# Patient Record
Sex: Female | Born: 1935 | Race: White | Hispanic: No | Marital: Married | Smoking: Never smoker
Health system: Southern US, Community
[De-identification: ages and names within clinical notes are randomized; demographics above are authoritative.]

## PROBLEM LIST (undated history)

## (undated) DIAGNOSIS — R55 Syncope and collapse: Secondary | ICD-10-CM

## (undated) DIAGNOSIS — R011 Cardiac murmur, unspecified: Secondary | ICD-10-CM

## (undated) DIAGNOSIS — E785 Hyperlipidemia, unspecified: Secondary | ICD-10-CM

## (undated) DIAGNOSIS — I10 Essential (primary) hypertension: Secondary | ICD-10-CM

## (undated) DIAGNOSIS — I38 Endocarditis, valve unspecified: Secondary | ICD-10-CM

## (undated) HISTORY — DX: Syncope and collapse: R55

## (undated) HISTORY — DX: Hyperlipidemia, unspecified: E78.5

## (undated) HISTORY — DX: Essential (primary) hypertension: I10

## (undated) HISTORY — PX: CHOLECYSTECTOMY: SHX55

## (undated) HISTORY — DX: Endocarditis, valve unspecified: I38

## (undated) HISTORY — PX: OTHER SURGICAL HISTORY: SHX169

## (undated) HISTORY — DX: Cardiac murmur, unspecified: R01.1

## (undated) HISTORY — PX: SUBACROMIAL DECOMPRESSION: SHX5174

---

## 2004-06-29 ENCOUNTER — Other Ambulatory Visit: Payer: Self-pay

## 2006-11-20 ENCOUNTER — Ambulatory Visit: Payer: Self-pay | Admitting: Family Medicine

## 2009-01-17 ENCOUNTER — Ambulatory Visit: Payer: Self-pay | Admitting: Nephrology

## 2009-04-22 ENCOUNTER — Emergency Department: Payer: Self-pay | Admitting: Emergency Medicine

## 2011-04-25 ENCOUNTER — Observation Stay: Payer: Self-pay | Admitting: Internal Medicine

## 2011-12-25 ENCOUNTER — Ambulatory Visit: Payer: Self-pay | Admitting: Family Medicine

## 2012-06-29 ENCOUNTER — Ambulatory Visit: Payer: Self-pay | Admitting: Family Medicine

## 2012-07-14 ENCOUNTER — Ambulatory Visit: Payer: Self-pay | Admitting: Neurology

## 2012-11-14 ENCOUNTER — Observation Stay: Payer: Self-pay | Admitting: Student

## 2012-11-14 LAB — URINALYSIS, COMPLETE
Blood: NEGATIVE
Glucose,UR: NEGATIVE mg/dL (ref 0–75)
Ketone: NEGATIVE
Leukocyte Esterase: NEGATIVE
Nitrite: NEGATIVE
Specific Gravity: 1.006 (ref 1.003–1.030)
Squamous Epithelial: 3

## 2012-11-14 LAB — BASIC METABOLIC PANEL
Chloride: 106 mmol/L (ref 98–107)
Co2: 25 mmol/L (ref 21–32)
Creatinine: 1.86 mg/dL — ABNORMAL HIGH (ref 0.60–1.30)
EGFR (Non-African Amer.): 25 — ABNORMAL LOW
Osmolality: 283 (ref 275–301)
Potassium: 4 mmol/L (ref 3.5–5.1)

## 2012-11-14 LAB — CBC
MCHC: 33.8 g/dL (ref 32.0–36.0)
Platelet: 262 10*3/uL (ref 150–440)
RDW: 13.7 % (ref 11.5–14.5)
WBC: 10.7 10*3/uL (ref 3.6–11.0)

## 2012-11-14 LAB — CK-MB: CK-MB: <0.5 ng/mL — ABNORMAL LOW (ref 0.5–3.6)

## 2012-11-14 LAB — TROPONIN I
Troponin-I: <0.02 ng/mL
Troponin-I: <0.02 ng/mL

## 2012-11-15 LAB — BASIC METABOLIC PANEL
Anion Gap: 7 (ref 7–16)
Calcium, Total: 8.6 mg/dL (ref 8.5–10.1)
Chloride: 109 mmol/L — ABNORMAL HIGH (ref 98–107)
Co2: 23 mmol/L (ref 21–32)
Osmolality: 284 (ref 275–301)

## 2012-11-15 LAB — CBC WITH DIFFERENTIAL/PLATELET
Basophil #: 0.1 10*3/uL (ref 0.0–0.1)
HCT: 26.3 % — ABNORMAL LOW (ref 35.0–47.0)
Lymphocyte #: 2.5 10*3/uL (ref 1.0–3.6)
MCHC: 33.7 g/dL (ref 32.0–36.0)
MCV: 98 fL (ref 80–100)
Monocyte #: 1.2 x10 3/mm — ABNORMAL HIGH (ref 0.2–0.9)
Monocyte %: 13.8 %
Neutrophil #: 3.9 10*3/uL (ref 1.4–6.5)
Platelet: 258 10*3/uL (ref 150–440)
RDW: 14.1 % (ref 11.5–14.5)
WBC: 8.5 10*3/uL (ref 3.6–11.0)

## 2012-11-15 LAB — TROPONIN I: Troponin-I: <0.02 ng/mL

## 2012-12-07 ENCOUNTER — Encounter: Payer: Self-pay | Admitting: *Deleted

## 2012-12-08 ENCOUNTER — Ambulatory Visit: Payer: Self-pay | Admitting: Cardiovascular Disease

## 2012-12-11 ENCOUNTER — Encounter: Payer: Self-pay | Admitting: *Deleted

## 2012-12-22 ENCOUNTER — Encounter: Payer: Self-pay | Admitting: Cardiovascular Disease

## 2012-12-22 ENCOUNTER — Ambulatory Visit (INDEPENDENT_AMBULATORY_CARE_PROVIDER_SITE_OTHER): Payer: Medicare Other | Admitting: Cardiovascular Disease

## 2012-12-22 VITALS — BP 163/82 | HR 84 | Ht 61.0 in | Wt 131.0 lb

## 2012-12-22 DIAGNOSIS — I10 Essential (primary) hypertension: Secondary | ICD-10-CM

## 2012-12-22 DIAGNOSIS — R55 Syncope and collapse: Secondary | ICD-10-CM

## 2012-12-22 NOTE — Patient Instructions (Addendum)
Follow up as needed

## 2012-12-23 ENCOUNTER — Encounter: Payer: Self-pay | Admitting: Cardiovascular Disease

## 2012-12-23 DIAGNOSIS — I10 Essential (primary) hypertension: Secondary | ICD-10-CM | POA: Insufficient documentation

## 2012-12-23 DIAGNOSIS — R55 Syncope and collapse: Secondary | ICD-10-CM | POA: Insufficient documentation

## 2012-12-23 NOTE — Progress Notes (Signed)
HPI  This is an 79 year old pleasant female who is here today for a second opinion regarding syncope. She is accompanied by her daughter will arrange for the appointment. The patient's primary care physician is Dr. Yetta Barre. She has seen Dr. Gwen Pounds over the last few years. She has prolonged history of hypertension. She started having syncope 3 years ago which has been intermittent since then. She had 3 syncopal episodes in 2013. Most of these episodes happen while she is sitting down and not with a change in position. She usually feels dizzy followed by loss of consciousness for a few minutes. No seizure activity or incontinence. No confusion after the episode. She had no physical injuries except for one time when it happened while she was in the bathroom. One episode happened in the summertime when she did not drink enough fluids. She denies any chest pain or dyspnea. She underwent extensive cardiac and neurologic workup. She was seen by Dr. Sherryll Burger in neurology as well and underwent MRI and EEG. She was told there was no seizure activity. She denies palpitations. Her cardiac workup included a Holter monitor which showed only premature beats. A nuclear stress test which showed no evidence of ischemia and no exercise induced arrhythmia. Echocardiogram showed normal LV systolic function with moderate mitral and tricuspid regurgitation. Carotid duplex ultrasound showed no evidence of significant disease. She has chronic kidney disease. Lisinopril was stopped recently due to his dizziness. The dose of carvedilol was also decreased by half due to recurrent syncope.  No Known Allergies   Current Outpatient Prescriptions on File Prior to Visit  Medication Sig Dispense Refill  . amLODipine (NORVASC) 5 MG tablet Take 5 mg by mouth daily.      . sodium bicarbonate 650 MG tablet Take 650 mg by mouth 3 (three) times daily.         Past Medical History  Diagnosis Date  . Hyperlipidemia   . Syncope   .  Valvular heart disease   . Heart murmur   . Hypertension      Past Surgical History  Procedure Date  . Cholecystectomy   . Cataract surgery   . Cref     right distal radius fracture  . Subacromial decompression     left     History reviewed. No pertinent family history.   History   Social History  . Marital Status: Married    Spouse Name: N/A    Number of Children: N/A  . Years of Education: N/A   Occupational History  . Not on file.   Social History Main Topics  . Smoking status: Never Smoker   . Smokeless tobacco: Not on file  . Alcohol Use: No  . Drug Use: No  . Sexually Active:    Other Topics Concern  . Not on file   Social History Narrative  . No narrative on file     ROS Constitutional: Negative for fever, chills, diaphoresis, activity change, appetite change and fatigue.  HENT: Negative for hearing loss, nosebleeds, congestion, sore throat, facial swelling, drooling, trouble swallowing, neck pain, voice change, sinus pressure and tinnitus.  Eyes: Negative for photophobia, pain, discharge and visual disturbance.  Respiratory: Negative for apnea, cough, chest tightness, shortness of breath and wheezing.  Cardiovascular: Negative for chest pain, palpitations and leg swelling.  Gastrointestinal: Negative for nausea, vomiting, abdominal pain, diarrhea, constipation, blood in stool and abdominal distention.  Genitourinary: Negative for dysuria, urgency, frequency, hematuria and decreased urine volume.  Musculoskeletal: Negative for  myalgias, back pain, joint swelling, arthralgias and gait problem.  Skin: Negative for color change, pallor, rash and wound.  Neurological: Negative for dizziness, tremors, seizures, speech difficulty, weakness, numbness and headaches.  Psychiatric/Behavioral: Negative for suicidal ideas, hallucinations, behavioral problems and agitation. The patient is not nervous/anxious.     PHYSICAL EXAM   BP 163/82  Pulse 84  Ht 5'  1" (1.549 m)  Wt 131 lb (59.421 kg)  BMI 24.75 kg/m2 Constitutional: She is oriented to person, place, and time. She appears well-developed and well-nourished. No distress.  HENT: No nasal discharge.  Head: Normocephalic and atraumatic.  Eyes: Pupils are equal and round. Right eye exhibits no discharge. Left eye exhibits no discharge.  Neck: Normal range of motion. Neck supple. No JVD present. No thyromegaly present.  Cardiovascular: Normal rate, regular rhythm, normal heart sounds. Exam reveals no gallop and no friction rub. There is a 2/6 systolic ejection murmur at the aortic and left sternal border. Pulmonary/Chest: Effort normal and breath sounds normal. No stridor. No respiratory distress. She has no wheezes. She has no rales. She exhibits no tenderness.  Abdominal: Soft. Bowel sounds are normal. She exhibits no distension. There is no tenderness. There is no rebound and no guarding.  Musculoskeletal: Normal range of motion. She exhibits no edema and no tenderness.  Neurological: She is alert and oriented to person, place, and time. Coordination normal.  Skin: Skin is warm and dry. No rash noted. She is not diaphoretic. No erythema. No pallor.  Psychiatric: She has a normal mood and affect. Her behavior is normal. Judgment and thought content normal.     NFA:OZHYQ  Rhythm  Low voltage in precordial leads.   -Poor R-wave progression -may be secondary to pulmonary disease   consider old anterior infarct.   ABNORMAL    ASSESSMENT AND PLAN

## 2012-12-23 NOTE — Assessment & Plan Note (Signed)
The patient has recurrent and explained syncope. Neurologic workup was unremarkable. These episodes are not orthostatic. She is also not orthostatic by physical exam today. She does have a cardiac murmur suggestive of possible outflow obstruction. Most of these episodes happen at rest in the sitting position. One episode happened in the bathroom. Another episode happened in the setting of what seems to be volume depletion. After the visit, I reviewed her echocardiogram done at [facility]. There was significant basal septal hypertrophy with possible LVOT obstruction. Gradients were not recorded with Valsalva maneuver. I will discuss with the patient repeating echocardiogram with Valsalva maneuver to see if she has any LVOT obstruction to explain her recurrent syncope. I also discussed with them the possibility of obtaining longer outpatient telemetry to look for and diagnosed arrhythmia given that the Holter monitor was unrevealing.

## 2012-12-23 NOTE — Assessment & Plan Note (Signed)
I agree with accepting a higher target of blood pressure did have recurrent syncope.

## 2012-12-24 ENCOUNTER — Telehealth: Payer: Self-pay

## 2012-12-24 NOTE — Telephone Encounter (Signed)
Unable to get through at listed # for pt.  I tried 3 different ways and all 3 numbers were "nonworking" I found pt's grandson's # in [facility] system. I called this # and was able to [facility] That # was [phone]

## 2012-12-24 NOTE — Telephone Encounter (Signed)
Pt informed Understanding verb tx to [facility] to make appt for echo

## 2012-12-24 NOTE — Telephone Encounter (Signed)
Message copied by [facility], Jaquane Boughner E on Thu Dec 24, 2012  8:24 AM ------      Message from: Lorine Bears A      Created: Wed Dec 23, 2012  1:27 PM       Please let the patient know or her daughter that I reviewed the echocardiogram done at [facility]. There is a possibility that the cardiac murmur that she has might have something to do with her recurrent syncope. I want to repeat the echocardiogram in our office and request the echo to be done with Valsalva maneuver to check for left ventricular outflow tract obstruction.      I already ordered the echocardiogram.

## 2013-01-05 ENCOUNTER — Other Ambulatory Visit: Payer: Self-pay

## 2013-01-05 ENCOUNTER — Other Ambulatory Visit (INDEPENDENT_AMBULATORY_CARE_PROVIDER_SITE_OTHER): Payer: Medicare Other

## 2013-01-05 DIAGNOSIS — R55 Syncope and collapse: Secondary | ICD-10-CM

## 2013-04-20 ENCOUNTER — Ambulatory Visit: Payer: Self-pay | Admitting: Family Medicine

## 2013-07-13 ENCOUNTER — Ambulatory Visit: Payer: Self-pay | Admitting: Internal Medicine

## 2013-07-22 LAB — RETICULOCYTES
Absolute Retic Count: 0.0407 10*6/uL
Reticulocyte: 1.22 %

## 2013-07-22 LAB — OCCULT BLOOD X 1 CARD TO LAB, STOOL
Occult Blood, Feces: NEGATIVE
Occult Blood, Feces: NEGATIVE

## 2013-07-22 LAB — FERRITIN: Ferritin (ARMC): 41 ng/mL (ref 8–388)

## 2013-07-22 LAB — CANCER CENTER HEMOGLOBIN: HGB: 10.1 g/dL — ABNORMAL LOW (ref 12.0–16.0)

## 2013-07-23 ENCOUNTER — Ambulatory Visit: Payer: Self-pay | Admitting: Internal Medicine

## 2013-07-27 LAB — CANCER CENTER HEMOGLOBIN: HGB: 10.2 g/dL — ABNORMAL LOW (ref 12.0–16.0)

## 2013-08-10 LAB — CANCER CENTER HEMOGLOBIN: HGB: 10.3 g/dL — ABNORMAL LOW (ref 12.0–16.0)

## 2013-08-23 ENCOUNTER — Ambulatory Visit: Payer: Self-pay | Admitting: Internal Medicine

## 2013-09-07 LAB — IRON AND TIBC: Iron Bind.Cap.(Total): 316 ug/dL (ref 250–450)

## 2013-09-14 LAB — CBC CANCER CENTER
Basophil #: 0.1 x10 3/mm (ref 0.0–0.1)
Eosinophil %: 5.6 %
HCT: 28.2 % — ABNORMAL LOW (ref 35.0–47.0)
Lymphocyte %: 23.1 %
MCH: 31.8 pg (ref 26.0–34.0)
MCV: 95 fL (ref 80–100)
Monocyte #: 0.9 x10 3/mm (ref 0.2–0.9)
Neutrophil #: 4.5 x10 3/mm (ref 1.4–6.5)
Neutrophil %: 58.3 %
WBC: 7.7 x10 3/mm (ref 3.6–11.0)

## 2013-09-22 ENCOUNTER — Ambulatory Visit: Payer: Self-pay | Admitting: Internal Medicine

## 2013-09-27 LAB — CANCER CENTER HEMOGLOBIN: HGB: 10.3 g/dL — ABNORMAL LOW (ref 12.0–16.0)

## 2013-10-23 ENCOUNTER — Ambulatory Visit: Payer: Self-pay | Admitting: Internal Medicine

## 2013-10-26 LAB — CANCER CENTER HEMOGLOBIN: HGB: 9.9 g/dL — ABNORMAL LOW (ref 12.0–16.0)

## 2013-11-22 ENCOUNTER — Ambulatory Visit: Payer: Self-pay | Admitting: Internal Medicine

## 2014-01-18 ENCOUNTER — Ambulatory Visit: Payer: Self-pay | Admitting: Internal Medicine

## 2014-01-18 LAB — CANCER CENTER HEMOGLOBIN: HGB: 9.9 g/dL — ABNORMAL LOW (ref 12.0–16.0)

## 2014-01-23 ENCOUNTER — Ambulatory Visit: Payer: Self-pay | Admitting: Internal Medicine

## 2014-02-21 ENCOUNTER — Ambulatory Visit: Payer: Self-pay | Admitting: Family Medicine

## 2014-08-08 ENCOUNTER — Ambulatory Visit: Payer: Self-pay

## 2014-08-30 ENCOUNTER — Inpatient Hospital Stay: Payer: Self-pay | Admitting: Internal Medicine

## 2014-08-30 LAB — PRO B NATRIURETIC PEPTIDE: B-Type Natriuretic Peptide: 1261 pg/mL — ABNORMAL HIGH (ref 0–450)

## 2014-08-30 LAB — CBC
HCT: 25 % — ABNORMAL LOW (ref 35.0–47.0)
HGB: 7.8 g/dL — ABNORMAL LOW (ref 12.0–16.0)
MCH: 28.8 pg (ref 26.0–34.0)
MCHC: 31.3 g/dL — AB (ref 32.0–36.0)
MCV: 92 fL (ref 80–100)
PLATELETS: 414 10*3/uL (ref 150–440)
RBC: 2.72 10*6/uL — AB (ref 3.80–5.20)
RDW: 15.8 % — AB (ref 11.5–14.5)
WBC: 12.8 10*3/uL — AB (ref 3.6–11.0)

## 2014-08-30 LAB — BASIC METABOLIC PANEL
Anion Gap: 8 (ref 7–16)
BUN: 34 mg/dL — ABNORMAL HIGH (ref 7–18)
Calcium, Total: 8.8 mg/dL (ref 8.5–10.1)
Chloride: 108 mmol/L — ABNORMAL HIGH (ref 98–107)
Co2: 20 mmol/L — ABNORMAL LOW (ref 21–32)
Creatinine: 2.1 mg/dL — ABNORMAL HIGH (ref 0.60–1.30)
EGFR (Non-African Amer.): 21 — ABNORMAL LOW
GFR CALC AF AMER: 24 — AB
Glucose: 110 mg/dL — ABNORMAL HIGH (ref 65–99)
OSMOLALITY: 280 (ref 275–301)
Potassium: 4.3 mmol/L (ref 3.5–5.1)
SODIUM: 136 mmol/L (ref 136–145)

## 2014-08-30 LAB — TROPONIN I

## 2014-08-31 LAB — CBC WITH DIFFERENTIAL/PLATELET
BASOS ABS: 0.1 10*3/uL (ref 0.0–0.1)
BASOS PCT: 1.2 %
EOS ABS: 1.6 10*3/uL — AB (ref 0.0–0.7)
EOS PCT: 13.6 %
HCT: 29 % — ABNORMAL LOW (ref 35.0–47.0)
HGB: 9.5 g/dL — ABNORMAL LOW (ref 12.0–16.0)
LYMPHS PCT: 10.7 %
Lymphocyte #: 1.3 10*3/uL (ref 1.0–3.6)
MCH: 30 pg (ref 26.0–34.0)
MCHC: 32.8 g/dL (ref 32.0–36.0)
MCV: 92 fL (ref 80–100)
MONO ABS: 1.9 x10 3/mm — AB (ref 0.2–0.9)
MONOS PCT: 16.4 %
NEUTROS ABS: 6.8 10*3/uL — AB (ref 1.4–6.5)
Neutrophil %: 58.1 %
PLATELETS: 410 10*3/uL (ref 150–440)
RBC: 3.17 10*6/uL — ABNORMAL LOW (ref 3.80–5.20)
RDW: 15.1 % — ABNORMAL HIGH (ref 11.5–14.5)
WBC: 11.7 10*3/uL — ABNORMAL HIGH (ref 3.6–11.0)

## 2014-08-31 LAB — COMPREHENSIVE METABOLIC PANEL
ANION GAP: 9 (ref 7–16)
AST: 18 U/L (ref 15–37)
Albumin: 2.2 g/dL — ABNORMAL LOW (ref 3.4–5.0)
Alkaline Phosphatase: 67 U/L
BILIRUBIN TOTAL: 0.8 mg/dL (ref 0.2–1.0)
BUN: 31 mg/dL — AB (ref 7–18)
Calcium, Total: 8.8 mg/dL (ref 8.5–10.1)
Chloride: 107 mmol/L (ref 98–107)
Co2: 22 mmol/L (ref 21–32)
Creatinine: 2 mg/dL — ABNORMAL HIGH (ref 0.60–1.30)
EGFR (Non-African Amer.): 22 — ABNORMAL LOW
GFR CALC AF AMER: 26 — AB
GLUCOSE: 102 mg/dL — AB (ref 65–99)
OSMOLALITY: 282 (ref 275–301)
Potassium: 4.2 mmol/L (ref 3.5–5.1)
SGPT (ALT): 9 U/L — ABNORMAL LOW
Sodium: 138 mmol/L (ref 136–145)
Total Protein: 6.5 g/dL (ref 6.4–8.2)

## 2014-08-31 LAB — PROTIME-INR
INR: 1.1
PROTHROMBIN TIME: 13.7 s (ref 11.5–14.7)

## 2014-08-31 LAB — MAGNESIUM: Magnesium: 1.8 mg/dL

## 2014-09-01 LAB — BASIC METABOLIC PANEL
ANION GAP: 9 (ref 7–16)
BUN: 41 mg/dL — ABNORMAL HIGH (ref 7–18)
CALCIUM: 8.8 mg/dL (ref 8.5–10.1)
Chloride: 100 mmol/L (ref 98–107)
Co2: 24 mmol/L (ref 21–32)
Creatinine: 2.7 mg/dL — ABNORMAL HIGH (ref 0.60–1.30)
EGFR (Non-African Amer.): 16 — ABNORMAL LOW
GFR CALC AF AMER: 18 — AB
Glucose: 100 mg/dL — ABNORMAL HIGH (ref 65–99)
Osmolality: 277 (ref 275–301)
Potassium: 4.2 mmol/L (ref 3.5–5.1)
Sodium: 133 mmol/L — ABNORMAL LOW (ref 136–145)

## 2014-09-01 LAB — CBC WITH DIFFERENTIAL/PLATELET
BASOS ABS: 0.1 10*3/uL (ref 0.0–0.1)
BASOS PCT: 1.3 %
EOS PCT: 16.4 %
Eosinophil #: 1.9 10*3/uL — ABNORMAL HIGH (ref 0.0–0.7)
HCT: 28.7 % — AB (ref 35.0–47.0)
HGB: 9.3 g/dL — ABNORMAL LOW (ref 12.0–16.0)
Lymphocyte #: 2.1 10*3/uL (ref 1.0–3.6)
Lymphocyte %: 18.7 %
MCH: 29.6 pg (ref 26.0–34.0)
MCHC: 32.3 g/dL (ref 32.0–36.0)
MCV: 92 fL (ref 80–100)
Monocyte #: 2.1 x10 3/mm — ABNORMAL HIGH (ref 0.2–0.9)
Monocyte %: 18.2 %
Neutrophil #: 5.2 10*3/uL (ref 1.4–6.5)
Neutrophil %: 45.4 %
Platelet: 401 10*3/uL (ref 150–440)
RBC: 3.12 10*6/uL — AB (ref 3.80–5.20)
RDW: 15.3 % — ABNORMAL HIGH (ref 11.5–14.5)
WBC: 11.4 10*3/uL — AB (ref 3.6–11.0)

## 2014-09-02 LAB — BASIC METABOLIC PANEL
ANION GAP: 9 (ref 7–16)
BUN: 41 mg/dL — ABNORMAL HIGH (ref 7–18)
CO2: 22 mmol/L (ref 21–32)
CREATININE: 2.34 mg/dL — AB (ref 0.60–1.30)
Calcium, Total: 8.4 mg/dL — ABNORMAL LOW (ref 8.5–10.1)
Chloride: 98 mmol/L (ref 98–107)
EGFR (African American): 21 — ABNORMAL LOW
EGFR (Non-African Amer.): 18 — ABNORMAL LOW
GLUCOSE: 98 mg/dL (ref 65–99)
OSMOLALITY: 269 (ref 275–301)
Potassium: 3.8 mmol/L (ref 3.5–5.1)
SODIUM: 129 mmol/L — AB (ref 136–145)

## 2014-09-26 ENCOUNTER — Ambulatory Visit: Payer: Self-pay | Admitting: Specialist

## 2014-10-18 ENCOUNTER — Ambulatory Visit: Payer: Self-pay | Admitting: Internal Medicine

## 2014-10-18 LAB — CANCER CENTER HEMOGLOBIN: HGB: 11.2 g/dL — AB (ref 12.0–16.0)

## 2014-10-18 LAB — FERRITIN: Ferritin (ARMC): 52 ng/mL (ref 8–388)

## 2014-10-23 ENCOUNTER — Ambulatory Visit: Payer: Self-pay | Admitting: Internal Medicine

## 2014-11-15 LAB — FERRITIN: Ferritin (ARMC): 42 ng/mL (ref 8–388)

## 2014-11-22 ENCOUNTER — Ambulatory Visit: Payer: Self-pay | Admitting: Internal Medicine

## 2014-12-14 LAB — CANCER CENTER HEMOGLOBIN: HGB: 10.6 g/dL — ABNORMAL LOW (ref 12.0–16.0)

## 2014-12-23 ENCOUNTER — Ambulatory Visit: Payer: Self-pay | Admitting: Internal Medicine

## 2015-01-03 LAB — CBC CANCER CENTER
Basophil #: 0 x10 3/mm (ref 0.0–0.1)
Basophil %: 0.3 %
EOS PCT: 6.7 %
Eosinophil #: 0.6 x10 3/mm (ref 0.0–0.7)
HCT: 33.7 % — AB (ref 35.0–47.0)
HGB: 10.9 g/dL — ABNORMAL LOW (ref 12.0–16.0)
LYMPHS ABS: 2.3 x10 3/mm (ref 1.0–3.6)
Lymphocyte %: 23.9 %
MCH: 31.5 pg (ref 26.0–34.0)
MCHC: 32.3 g/dL (ref 32.0–36.0)
MCV: 97 fL (ref 80–100)
Monocyte #: 1.1 x10 3/mm — ABNORMAL HIGH (ref 0.2–0.9)
Monocyte %: 11 %
NEUTROS ABS: 5.6 x10 3/mm (ref 1.4–6.5)
Neutrophil %: 58.1 %
PLATELETS: 215 x10 3/mm (ref 150–440)
RBC: 3.46 10*6/uL — AB (ref 3.80–5.20)
RDW: 15.7 % — AB (ref 11.5–14.5)
WBC: 9.6 x10 3/mm (ref 3.6–11.0)

## 2015-01-23 ENCOUNTER — Ambulatory Visit: Payer: Self-pay | Admitting: Internal Medicine

## 2015-01-23 IMAGING — CR DG CHEST 2V
2 series · 2 of 2 positions shown · non-contrast
Comparison: 04/25/2011.

CLINICAL DATA: Shortness of breath.

EXAM:
CHEST  2 VIEW

[chest pa]
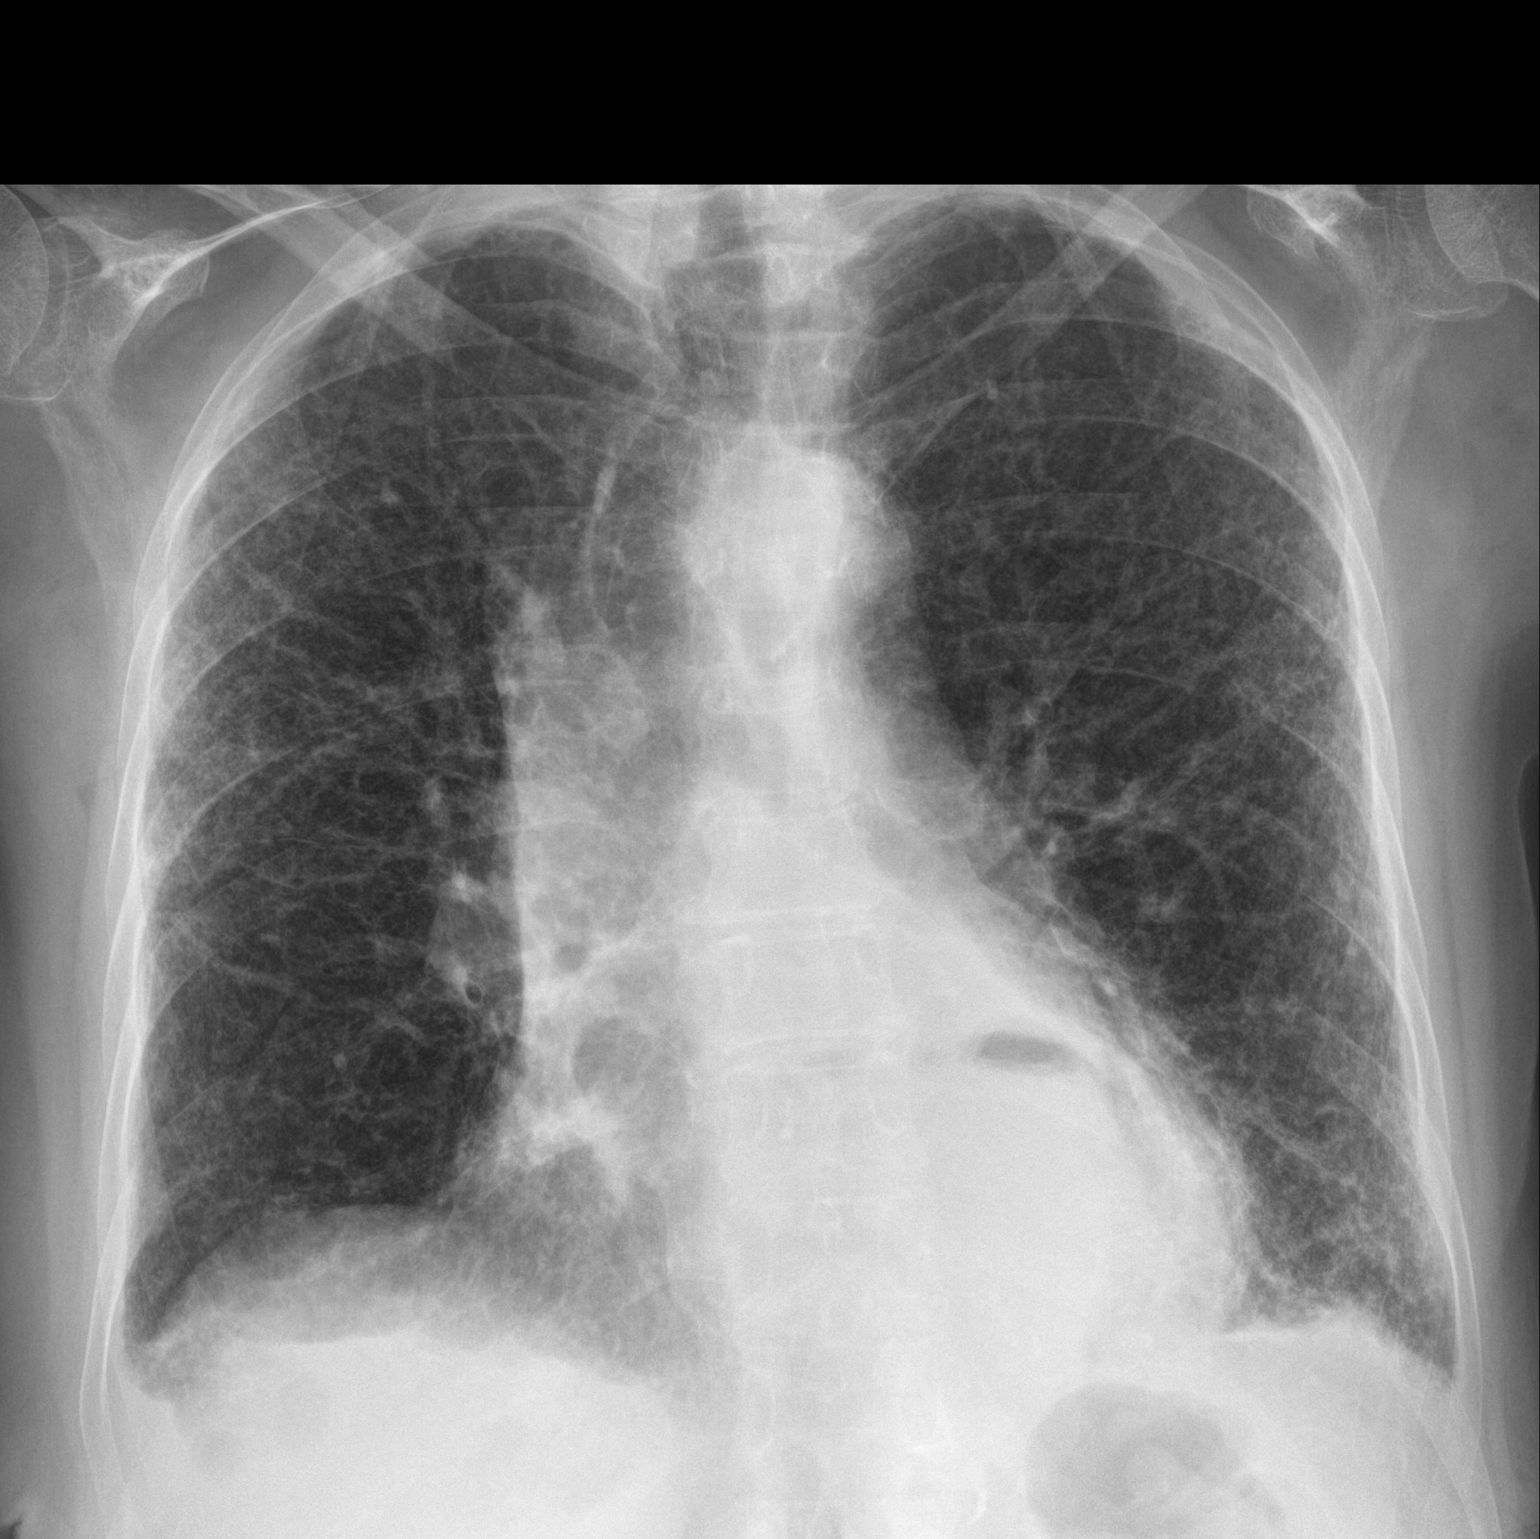

[chest lat]
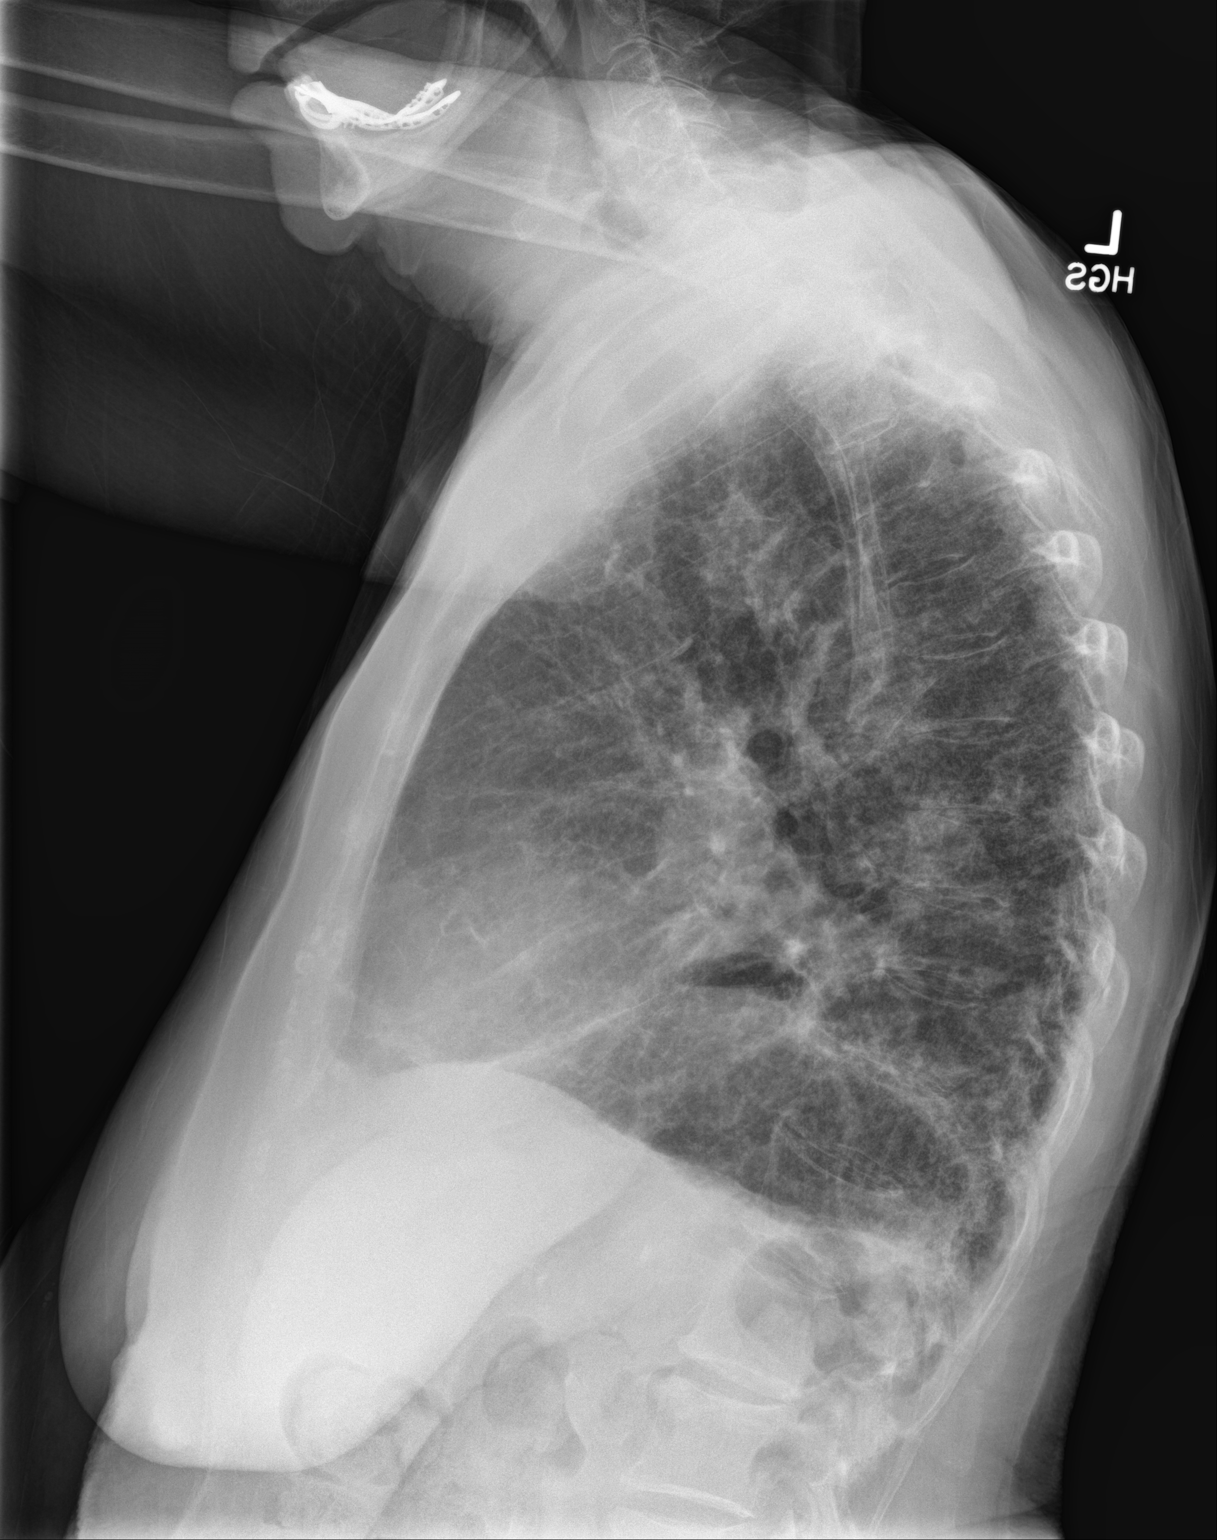

[2 of 2 positions shown; findings below may reference images not displayed]

FINDINGS: The heart is borderline enlarged but stable. The mediastinal and
hilar contours are stable. There is tortuosity and calcification of
the thoracic aorta a moderate to large size hiatal hernia is noted.
Definite progression of interstitial lung disease with peripheral
pulmonary fibrosis. High-resolution chest CT imaging would be
helpful for further evaluation (non urgent). No infiltrates or
effusions. The bony thorax is intact.
IMPRESSION: Significant progression of interstitial lung disease.

No infiltrates or effusions.

Moderate to large hiatal hernia.

## 2015-02-14 IMAGING — CR DG CHEST 2V
1 series · 2 of 2 positions shown · non-contrast
Comparison: 08/08/2014

CLINICAL DATA: Shortness of breath.

EXAM:
CHEST  2 VIEW

[Series 1: x chest ap · 0.14mm/px · 2 of 2 slices shown]
[im 1/2]
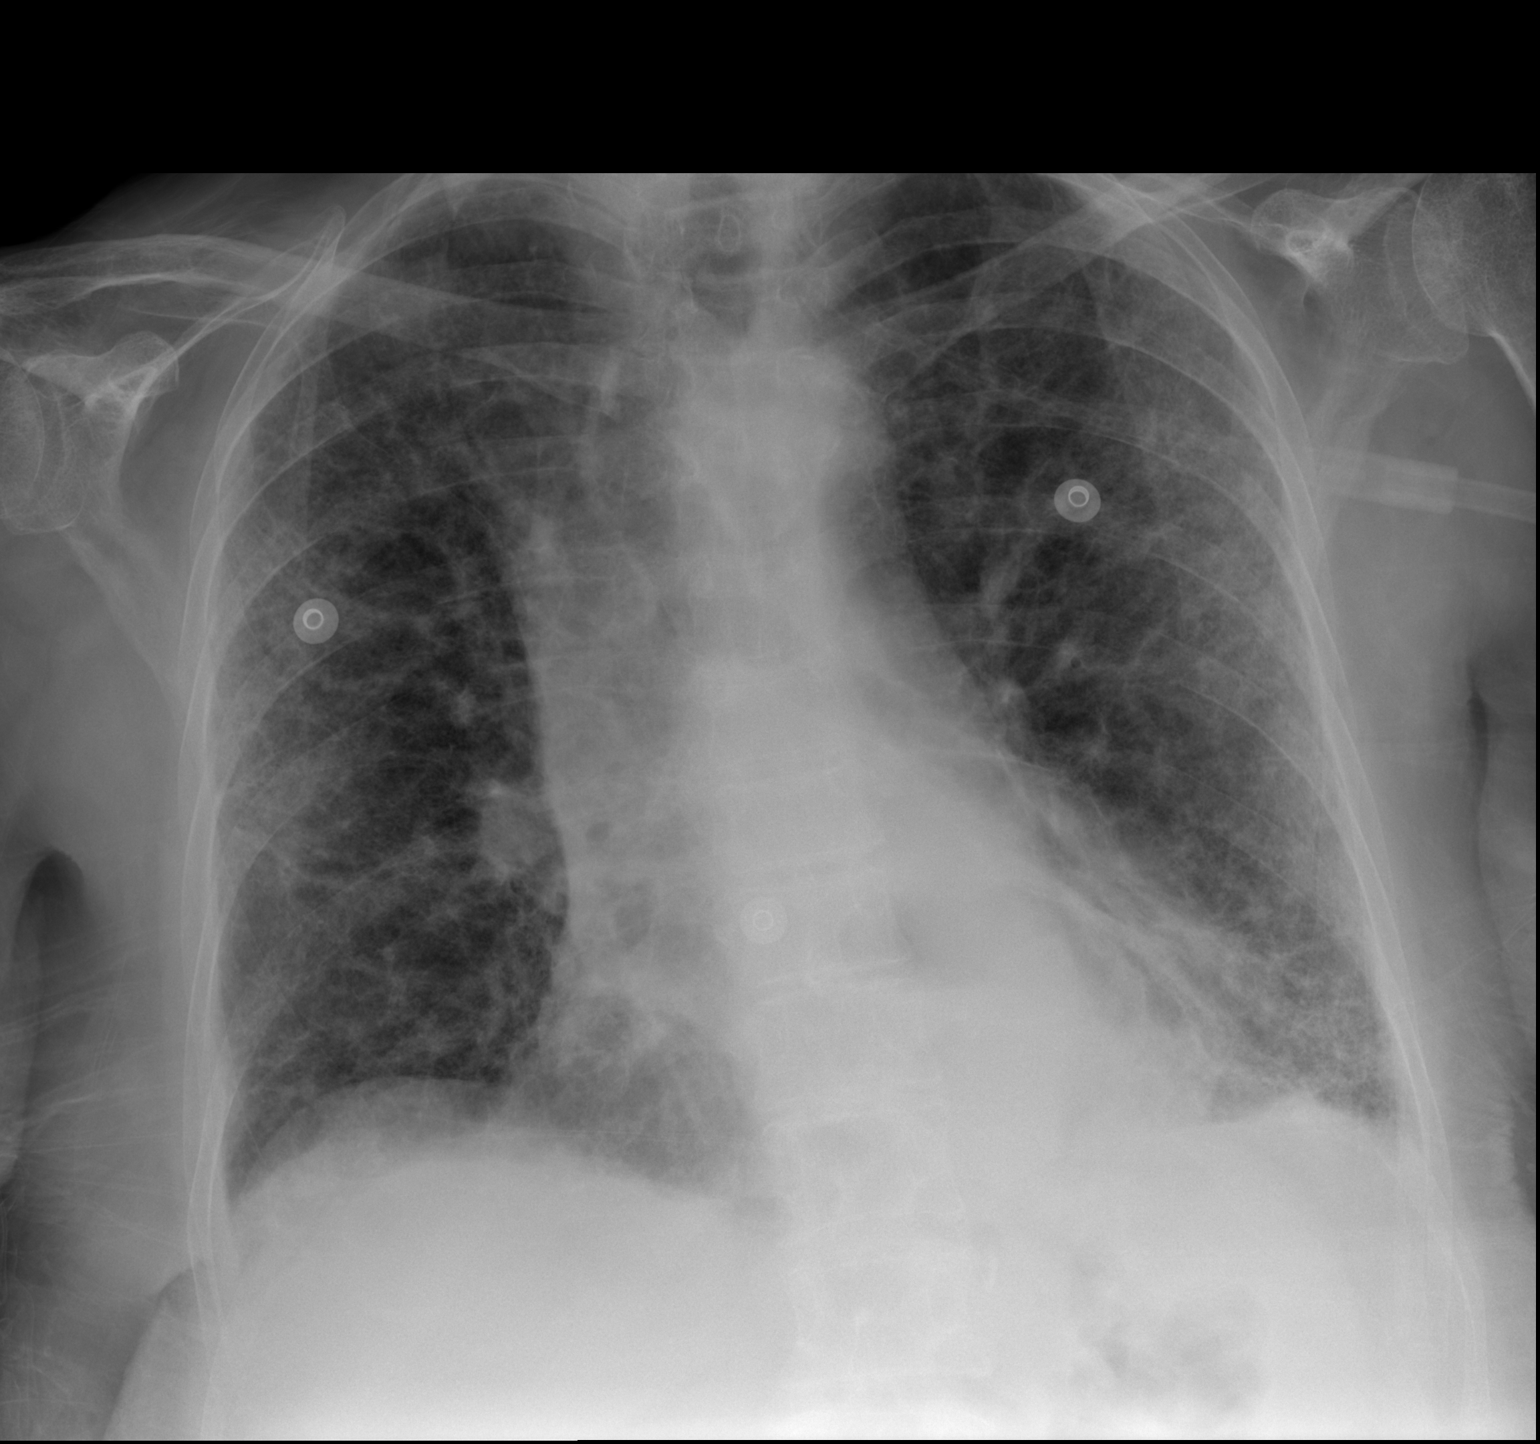
[im 2/2]
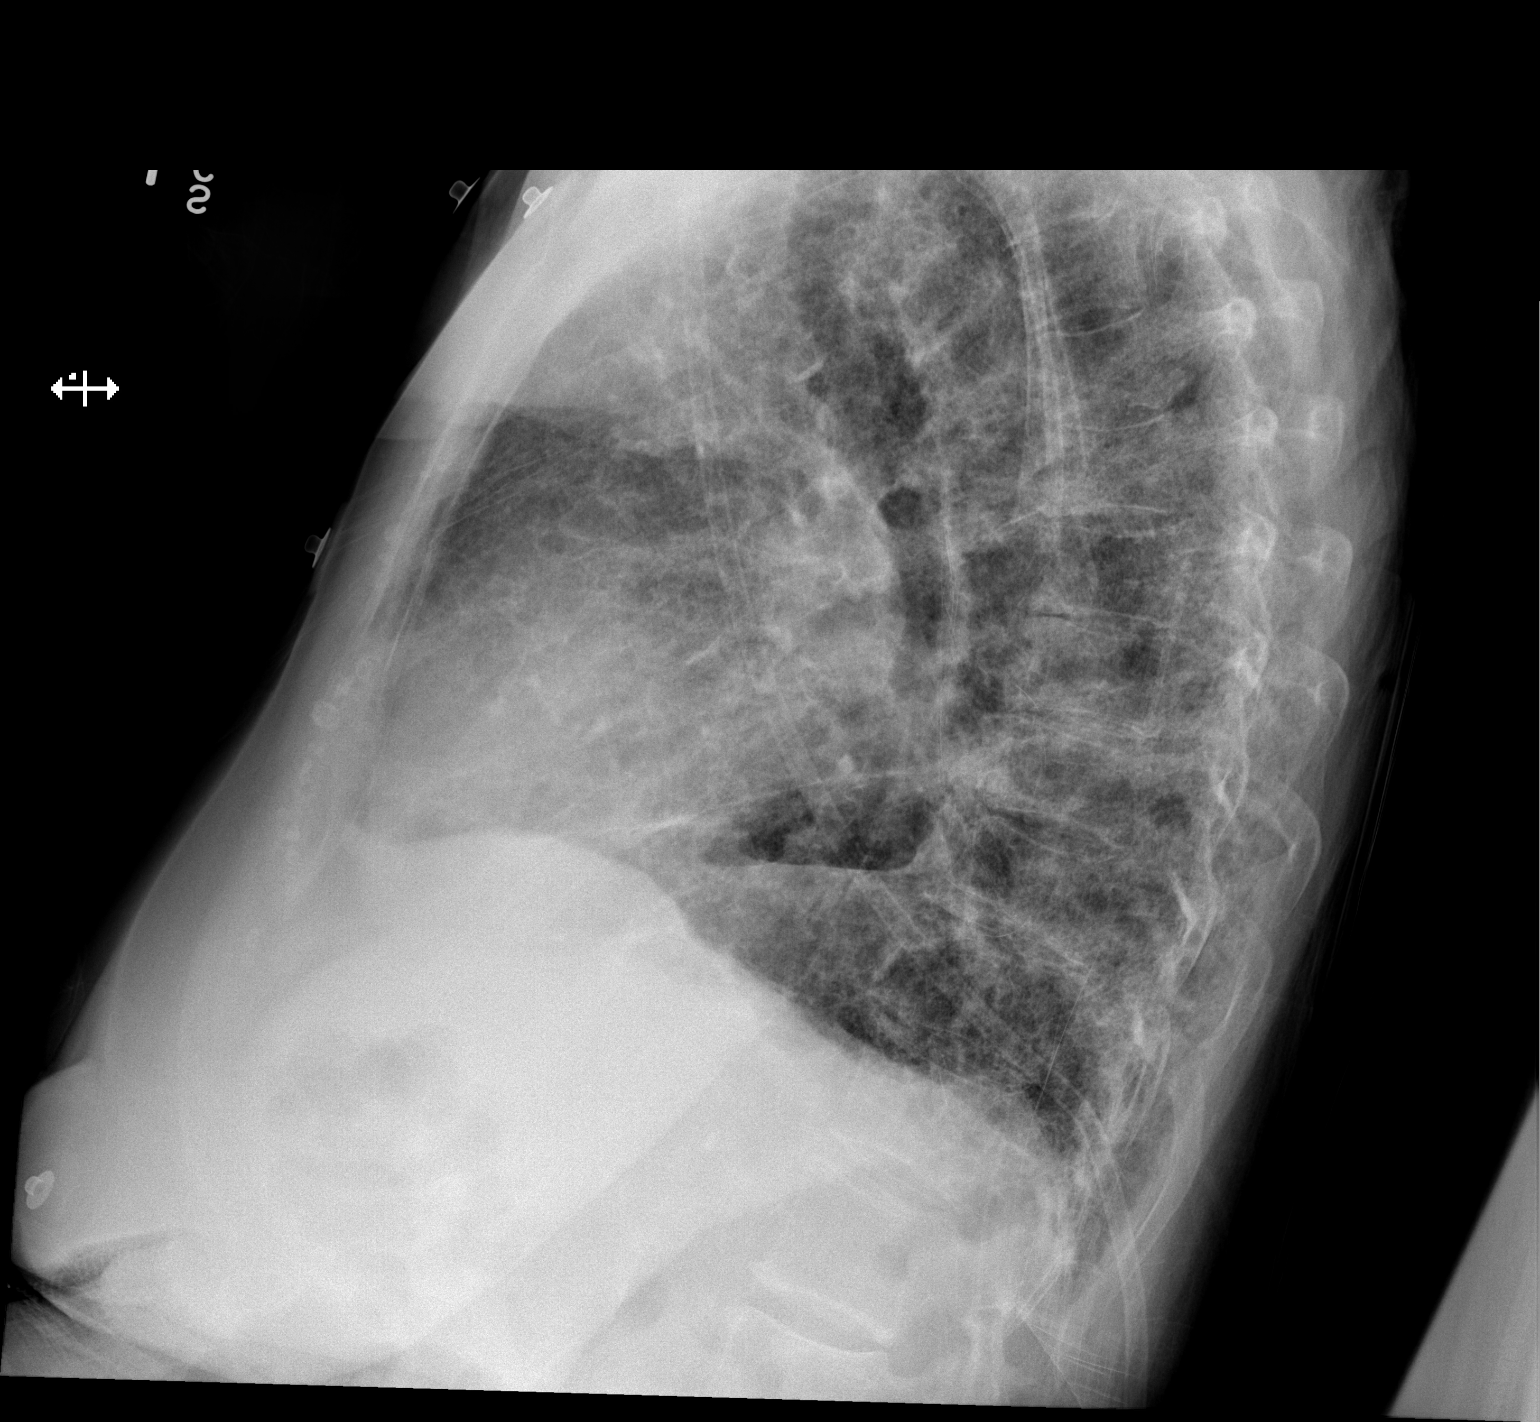

[2 of 2 positions shown; findings below may reference images not displayed]

FINDINGS: Osteopenia. Midline trachea. Mild cardiomegaly. Moderate hiatal
hernia suspected. Superior mediastinal soft tissue fullness is
likely due to AP portable technique on the frontal. No pleural
effusion or pneumothorax. Lower lobe predominant and peripheral
interstitial prominence is progressive. Left base pulmonary opacity
is new or increased.
IMPRESSION: Progressive peripheral and lower lobe interstitial prominence.
Suspicious for interstitial lung disease, possibly usual
interstitial pneumonia. A chest CT is pending an will be
informative. Cannot exclude superimposed mild interstitial edema.

Moderate hiatal hernia.

Patchy left base opacity for which superimposed infection or
aspiration is a concern.

## 2015-02-21 ENCOUNTER — Ambulatory Visit
Admit: 2015-02-21 | Disposition: A | Discharge status: Home / Self Care | Payer: Self-pay | Attending: Internal Medicine | Admitting: Internal Medicine

## 2015-03-13 IMAGING — CR DG CHEST 2V
2 series · 2 of 2 positions shown · non-contrast
Comparison: CT chest 08/30/2014, chest x-ray 08/30/2014.

CLINICAL DATA: Shortness of breath.  Follow-up pneumonia x6 weeks.

EXAM:
CHEST  2 VIEW

[chest pa]
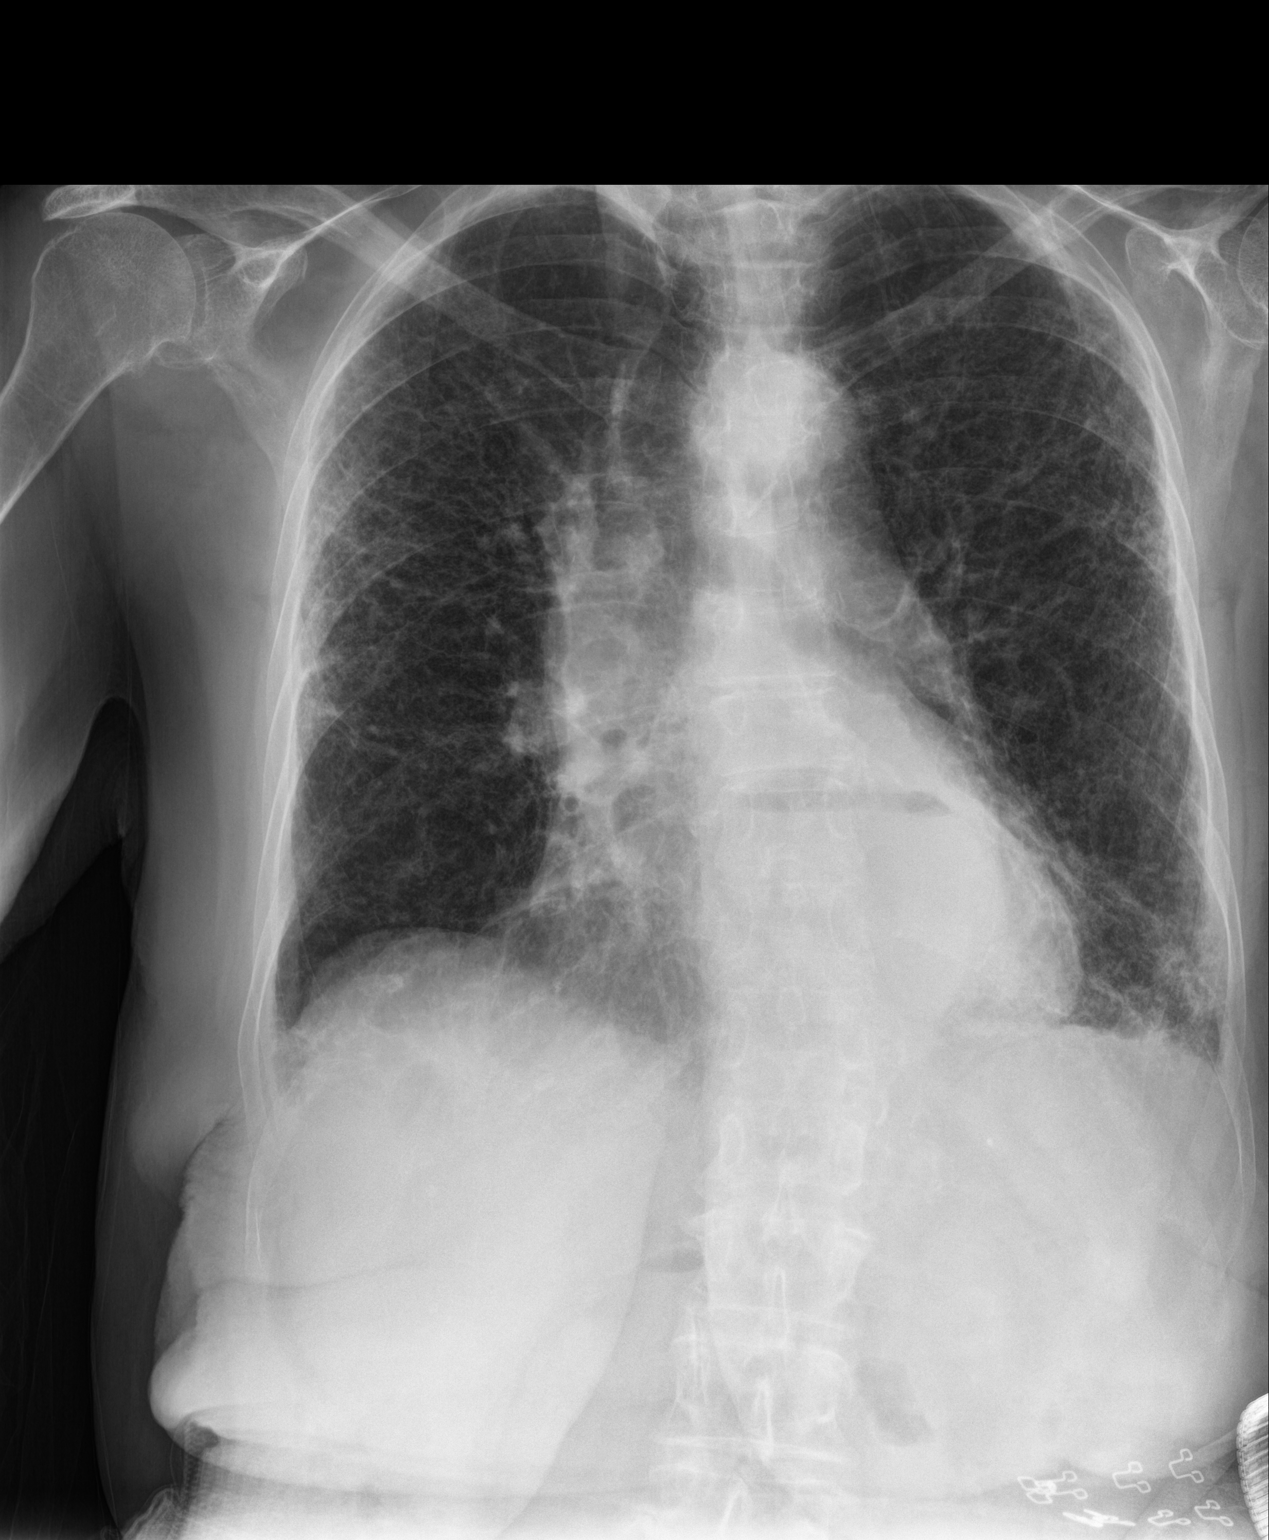

[chest lat]
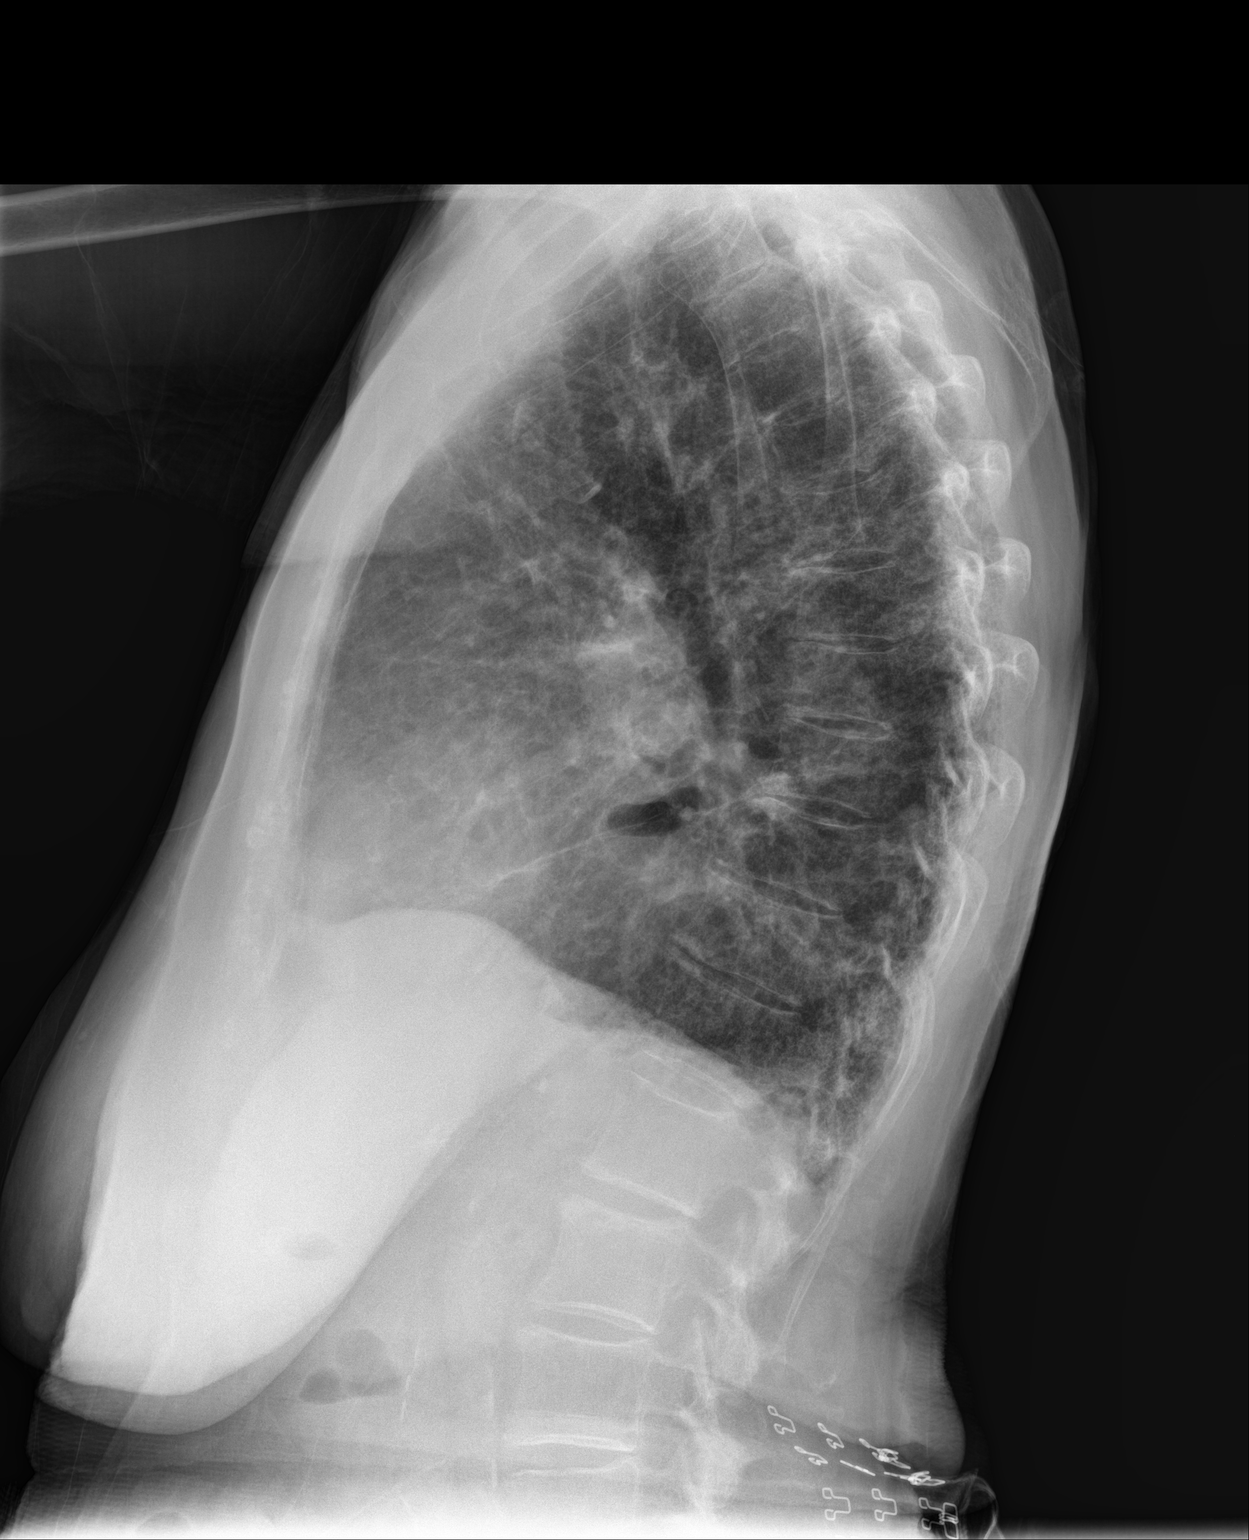

[2 of 2 positions shown; findings below may reference images not displayed]

FINDINGS: Mediastinum and hilar structures are normal. Stable cardiomegaly,
normal pulmonary vascularity. Stable unchanged diffuse from
interstitial lung disease present. No improvement from prior exam.
No significant pleural effusion or pneumothorax. Sliding hiatal
hernia. No acute bony abnormality.
IMPRESSION: 1. Diffuse pulmonary interstitial lung disease, no improvement from
prior study of 08/30/2014.

2.  Sliding hiatal hernia.

## 2015-03-24 ENCOUNTER — Ambulatory Visit
Admit: 2015-03-24 | Disposition: A | Discharge status: Home / Self Care | Payer: Self-pay | Attending: Internal Medicine | Admitting: Internal Medicine

## 2015-04-11 NOTE — Discharge Summary (Signed)
PATIENT NAME:  Misty Martinez, Misty Martinez MR#:  [id number] DATE OF BIRTH:  [birth year]  DATE OF ADMISSION:  11/14/2012 DATE OF DISCHARGE:  11/16/2012  CONSULTANT: Dr. Welton FlakesKhan from cardiology.   CHIEF COMPLAINT: Syncope.   DISCHARGE DIAGNOSES:  1. Recurrent syncope, possibly cardiac arrhythmia in nature.  2. Hypertension.  3. Chronic kidney disease, stage III to IV.  4. Arthritis.   DISCHARGE MEDICATIONS:  1. Coreg 12.5 mg 1 tab 2 times a day.  2. Aspirin 81 mg daily. 3. Sodium bicarbonate 650 mg 1 tab three times a day.  4. Amlodipine 5 mg daily.  5. Ocuvite lutein 1 tab once a day.  6. Vitamin B12 100 mcg one cap daily.   DIET: Low sodium ADA diet, renal diet.   ACTIVITY: As tolerated.   DISCHARGE FOLLOW UP: Please follow with your PCP, Dr. Gwen PoundsKowalski and ENT within 1 to 2 weeks.   DISPOSITION: Home.   HISTORY OF PRESENT ILLNESS: For full because of the history and physical please see the dictation on 11/14/2012 by Dr. Elpidio AnisSudini, but briefly this is a pleasant 81 year old female with chronic kidney disease stage III and recurrent syncope, hypertension who presents with another syncopal episode passing out for about a minute while at church. Per history and physical no pulse was noticed and cardiopulmonary resuscitation was started. Patient woke up within a minute. In the ER cardiac enzymes were normal. EKG: Look unchanged from prior and orthostatic vitals were negative for orthostatic hypotension therefore was admitted to the hospitalist service for further evaluation and management.    LABORATORY, DIAGNOSTIC AND RADIOLOGICAL DATA: Initial creatinine of 1.86, on 11/24 it was 1.75. Troponins negative x3. CK-MB were within normal limits x3. Initial WBC 10.7, hemoglobin 9.6, platelets 262. Urinalysis not suggestive of infection. Echocardiogram has been done, result is not back yet.   HOSPITAL COURSE: Patient was admitted to the hospitalist service with remote telemetry off unit. Patient had no  significant arrhythmias. Patient went back to her baseline. She was ruled out for acute coronary syndrome. She has had extensive work-up in the past including evaluation by cardiology and neurology with negative MRI, CAT scan and EEG per patient. At this point given the recurrent nature of her syncope I discussed the case with Dr. Gwen PoundsKowalski, her outpatient cardiologist. She was seen by Dr. Welton FlakesKhan from cardiology while she was inpatient. At this point the best point of follow up is for patient to undergo a 30 day event monitor to see if she has any significant arrhythmias which are infrequent. I have discussed the case personally with cardiopulmonary to try to arrange this and they will try to arrange it as an outpatient test only. Per Dr. Gwen PoundsKowalski she is okay to be discharged at this point with outpatient follow up also. She would also benefit from following with ENT for further testing. Her chronic kidney disease and blood pressure remained stable. She is back to her baseline and will be discharged with outpatient follow up.   TOTAL TIME SPENT: 35 minutes.   CODE STATUS: Patient is FULL CODE.   ____________________________ Krystal EatonShayiq Forest Pruden, MD sa:cms D: 11/16/2012 15:55:21 ET T: 11/17/2012 12:47:39 ET JOB#: [id number]  cc: Krystal EatonShayiq Shaana Acocella, MD, <Dictator> Lamar BlinksBruce J. Kowalski, MD Krystal EatonSHAYIQ Kyndal Gloster MD ELECTRONICALLY SIGNED 12/03/2012 11:10

## 2015-04-11 NOTE — H&P (Signed)
PATIENT NAME:  Misty Martinez, Misty Martinez MR#:  [id number] DATE OF BIRTH:  1927/10/20  DATE OF ADMISSION:  11/14/2012  PRIMARY CARE PHYSICIAN: Elizabeth Sauereanna Jones, MD  PRIMARY CARDIOLOGIST: Arnoldo HookerBruce Kowalski, MD  CHIEF COMPLAINT: Syncope.   The case was discussed with Dr. Shaune PollackLord, history obtained from the patient, family members at bedside, old records and imaging studies reviewed.  HISTORY OF PRESENT ILLNESS: This is an 81 year old Caucasian female patient with history of CKD stage III, prior syncope, and hypertension who presents to the Emergency Room with episode of syncope and passing out for one minute at church. The patient was sitting down at church and suddenly had a syncopal episode and collapsed. Two nurses in the church tried to start CPR but the patient woke up within a minute. The patient was not found to have any pulse at this time. Upon arrival by EMS, the patient was up, awake, and more comfortable. Here in the ER, the patient's cardiac enzymes are normal, EKG looks unchanged from prior, orthostatics are negative, and the patient is being admitted to the hospitalist service under observation for syncope. The patient has had syncopal episodes for over five years, but have been more frequent recently. The patient mentions that she has a distant feeling prior to an episode. She is extremely concerned that she might hurt herself. In the past, she had knee problems and fracture secondary to syncope.   The patient follows with Dr. Gwen PoundsKowalski and had follow-up in October, did not have any tests, a regular physical, and was told everything was fine. No change in medications. She did have an EEG in July of 2013 after syncope in June for suspicion for seizure, but was found to be normal. She had an echo and carotid Doppler's one year prior.   PAST MEDICAL HISTORY:  1. Hypertension.  2. Chronic kidney disease stage III. 3. Arthritis.   SOCIAL HISTORY: The patient lives with her husband, does not smoke, no alcohol,  and no illicit drugs.   CODE STATUS: FULL CODE.   ALLERGIES: No known drug allergies.   FAMILY HISTORY: Mother died at 4384 of Alzheimer's. Father died at 2389 of heart failure. Sister has hypertension. Brother died of alcoholism.   HOME MEDICATIONS:  1. Amlodipine 5 mg oral once a day.  2. Aspirin 81 mg oral once a day.  3. Coreg 12.5 mg oral twice a day. 4. Ocuvite 1 tablet oral once a day.  5. Sodium bicarbonate 650 mg oral three times daily. 6. Vitamin B12 100 mcg oral once a day.   REVIEW OF SYSTEMS: CONSTITUTIONAL: No fever, fatigue, or weakness. No blurred vision, pain, or redness. ENT: No tinnitus, ear pain, or hearing loss. RESPIRATORY: No cough, wheeze, or hemoptysis. CARDIOVASCULAR: No chest pain or orthopnea. Had syncope. GASTROINTESTINAL: No nausea, vomiting, diarrhea, or abdominal pain. GENITOURINARY: No dysuria, hematuria, or renal calculus. Has chronic kidney disease. ENDOCRINE: No polyuria, nocturia, or thyroid problems. HEMATOLOGIC/LYMPHATIC: No anemia, easy bruising, or bleeding. MUSCULOSKELETAL: Has arthritis in multiple joints. No gout. NEUROLOGIC: No focal numbness, weakness, or dysarthria. Positive syncope. PSYCHIATRIC: No anxiety or depression.   PHYSICAL EXAMINATION:   VITAL SIGNS: Temperature 97.8, blood pressure 144/53, and pulse 68.   Orthostatic vitals show blood pressure 123/49 and pulse 69 on lying down and 140/56 and 78 on standing up.   GENERAL: Elderly, moderately built, Caucasian female patient lying in bed, comfortable, conversational, cooperative with exam.   PSYCHIATRIC: Alert and oriented x3. Mood and affect appropriate. Judgment intact.   HEENT: Atraumatic,  normocephalic. Oral mucosa moist and pink. External ears and nose normal. No pallor. No icterus. Pupils bilaterally equal and reactive to light.   NECK: Supple. No thyromegaly. No palpable lymph nodes. Trachea midline. No carotid bruit or JVD.   CARDIOVASCULAR: S1 and S2, regular rate and rhythm  without any murmurs. No edema. Peripheral pulses 2+.   RESPIRATORY: Normal work of breathing. Clear to auscultation on both sides. Normal to percussion.   GI: Soft abdomen, nontender. Bowel sounds present. No hepatosplenomegaly palpable.   SKIN: Warm and dry. No petechiae, rash, or ulcers.   NEUROLOGICAL: Motor strength five out of five in upper and lower extremities. Sensation to fine touch intact all over. Cranial nerves II through XII intact.   LYMPHATIC: No cervical lymphadenopathy.   LABORATORY, DIAGNOSTIC AND RADIOLOGIC DATA: Glucose 144, BUN 31, creatinine 1.86, sodium 137, and potassium 4. Troponin less than 0.02. WBC 10.7 and hemoglobin 9.6.   Urinalysis shows no WBC or bacteria.   EKG shows normal sinus rhythm with nonspecific ST-T wave changes, unchanged from prior EKG.   ASSESSMENT AND PLAN:  1. Recurrent syncope. The patient has had multiple scans in the past which have not shown any etiology, but today the patient had prolonged one minute syncope with no pulse found, as per two nurses at the scene. CPR was not started. Concern for AV dissociation or pause. We will admit the patient on the telemetry floor. We will repeat a 2-D echocardiogram. We will not have to repeat any CT scan, EEG, or carotid Dopplers, which the patient had previously. We will consult Dr. Gwen Pounds, her cardiologist, for further input. We will see if the patient has any pause or arrhythmias on telemetry. Check two more sets of cardiac enzymes. Orthostatics negative.  2. Hypertension. Continue home medications.  3. Chronic kidney disease stage III, stable.  4. Deep vein thrombosis prophylaxis with heparin.   CODE STATUS: FULL CODE.   TIME SPENT: Time spent today on this case was 65 minutes with more than 50% time spent in coordination of care. ____________________________ Molinda Bailiff. Brodin Gelpi, MD srs:slb D: 11/14/2012 14:44:57 ET T: 11/14/2012 15:08:54 ET JOB#: [id number]  cc: Wardell Heath R. Elpidio Anis, MD,  <Dictator> Duanne Limerick, MD Lamar Blinks, MD Orie Fisherman MD ELECTRONICALLY SIGNED 11/14/2012 16:17

## 2015-04-11 NOTE — Consult Note (Signed)
PATIENT NAME:  Misty Martinez, Misty Martinez MR#:  [id number] DATE OF BIRTH:  03-Apr-1927  DATE OF CONSULTATION:  11/15/2012  REFERRING PHYSICIAN:   CONSULTING PHYSICIAN:  Laurier NancyShaukat A. Neeya Prigmore, MD  INDICATION FOR CONSULTATION: Syncope.   HISTORY OF PRESENT ILLNESS:  This is an 81 year old white female with a past medical history of renal insufficiency, multiple episodes of syncope, hypertension, who came into the Emergency Room with another episode where she passed out at church lasting for like one minute. She states she collapsed in the past also multiple times. She had a neurological work-up including MRI of the brain, carotid Doppler and electroencephalogram and saw neurologist. There was no evidence of seizures or any other neurological abnormalities. She was also evaluated cardiac point of view and everything was negative in the past. Now she again had another episode, so was brought by EMS for evaluation. She denies any chest pain, shortness of breath, or dizziness right now.   PAST MEDICAL HISTORY:  1. History of hypertension. 2. Renal insufficiency. 3. Arthritis.   SOCIAL HISTORY: Unremarkable.   ALLERGIES: None.   FAMILY HISTORY: Mother had Alzheimer, sister had hypertension. Brother died of alcoholism.   MEDICATIONS:  1. Aspirin. 2. Amlodipine. 3. Coreg. 4. Vitamins.   PHYSICAL EXAMINATION:  GENERAL: She is alert, oriented x3, in no acute distress.   VITAL SIGNS: Blood pressure 122/69, respirations 18, pulse 73, temperature 97.7, saturation 96.   NECK: No carotid bruit. No JVD.   LUNGS: Clear.   HEART: Regular rate and rhythm. A 2/6 systolic murmur at the left lower sternal border.   ABDOMEN: Soft, nontender, positive bowel sounds.   EXTREMITIES: No pedal edema.   NEUROLOGIC: She appears to be intact.   LABORATORY DATA: BUN is 32, creatinine 1.75. Hemoglobin is 8.9. Troponin is less than 0.02.   ASSESSMENT AND PLAN: The patient has had a syncopal episode. Agree with getting  echocardiogram to further evaluate. She had already neurological and cardiac work-up. She may have intermittent ventricular or supraventricular arrhythmia causing these syncopal episodes. She never had a catheterization but she has renal insufficiency and she is 85. She denies any chest pain. If this persists, may consider doing catheterization and may have to have EP evaluation. Thank you very much for the referral.   ____________________________ Laurier NancyShaukat A. Meleena Munroe, MD sak:ap D: 11/15/2012 10:48:00 ET T: 11/15/2012 11:02:52 ET JOB#: [id number]  cc: Laurier NancyShaukat A. Valinda Fedie, MD, <Dictator>  Laurier NancySHAUKAT A Sayda Grable MD ELECTRONICALLY SIGNED 11/17/2012 8:55

## 2015-04-15 NOTE — Consult Note (Signed)
PATIENT NAME:  Misty Martinez, Misty Martinez MR#:  [id number] DATE OF BIRTH:  30-Oct-1927  DATE OF CONSULTATION:  08/31/2014  REFERRING PHYSICIAN:  Delfino LovettVipul Shah, MD CONSULTING PHYSICIAN:  Lamar BlinksBruce J. Jawana Reagor, MD  REASON FOR CONSULTATION: Acute heart failure, respiratory failure, hypertension, valvular insufficiency, chronic kidney disease.   CHIEF COMPLAINT: "I'Martinez short of breath."   HISTORY OF PRESENT ILLNESS: This is an 81 year old female with known diastolic dysfunction with respiratory failure, hypertension, hyperlipidemia, mitral insufficiency who has had significant progression of severe shortness of breath over the last week, waxing and waning, with hypoxia and significant chest x-ray changes consistent with pneumonia and pulmonary edema. This is suggestive of acute on chronic diastolic congestive heart failure due to hypoxia and mitral valve insufficiency, non-rheumatic in nature. She also had peripheral vascular disease with carotid atherosclerosis, not effecting her at this time, exacerbated by anemia with a hemoglobin of 7.8, chronic kidney disease stage 4 with a GFR of 24, creatinine of 2.1. She did have a BNP of 1261, but an echocardiogram showing normal LV systolic function with ejection fraction of 60% and diastolic dysfunction. EKG showed normal sinus rhythm and the patient did not have a myocardial infarction with a normal troponin.  REVIEW OF SYSTEMS: Negative for vision change, ringing in the ears, hearing loss. Positive for cough, congestion. Negative for heartburn, nausea, vomiting, diarrhea, bloody stools, stomach pain, extremity pain, leg weakness, cramping of the buttocks, known blood clots, headaches, blackouts, dizzy spells, nosebleeds. Congestion was positive. No urinary frequency, skin lesions or skin rashes.   PAST MEDICAL HISTORY:  1.  Diastolic dysfunction.  2.  Hypertension.  3.  Valvular heart disease.  4.  Hyperlipidemia.  5.  Peripheral vascular disease with carotid atherosclerosis.   6.  Anemia.  7.  Chronic kidney disease.   FAMILY HISTORY: No family members with early onset of cardiovascular disease or hypertension.   SOCIAL HISTORY: Currently denies alcohol or tobacco use.   ALLERGIES: AS LISTED.   MEDICATIONS: As listed.   PHYSICAL EXAMINATION:  VITAL SIGNS: Blood pressure is 120/68 bilaterally. Heart rate is 70, upright, lying flat and reclining, irregular.  GENERAL: She is a well-appearing elderly female in no acute distress.  HEENT: No icterus, thyromegaly, ulcers, hemorrhage or xanthelasma.  CARDIOVASCULAR: Regular rate and rhythm. Normal S1 and S2. A 2/6 right upper sternal border murmur, nonradiating. PMI is inferiorly displaced. Carotid upstroke normal without bruit. Jugular venous pressure is normal.  LUNGS: Have expiratory wheezes, rhonchi and basilar crackles.  ABDOMEN: Soft, nontender, without hepatosplenomegaly or masses. Abdominal aorta is normal size without bruit.  EXTREMITIES: Show 2+ radial, femoral, dorsal pedal pulses, with no lower extremity edema, cyanosis, clubbing or ulcers.  NEUROLOGIC: She is oriented to time, place, and person, with normal mood and affect.   ASSESSMENT: An 81 year old female with acute on chronic diastolic dysfunction, congestive heart failure exacerbated by respiratory failure, bronchitis, hypoxia, anemia, chronic kidney disease stage 4, having peripheral vascular disease, hypertension, hyperlipidemia and mitral insufficiency.   RECOMMENDATIONS:  1.  Intravenous Lasix as able, watching closely for worsening chronic kidney disease for pulmonary edema.  2.  Oxygenation for hypoxia and heart failure.  3.  Serial ECG and enzymes to assess for possible myocardial infarction.  4.  Antibiotics for an acute respiratory failure and pneumonia.  5.  Continue hypertension control with beta blocker, but avoid ACE inhibitor due to significant chronic kidney disease  6.  No further intervention of non-rheumatic mitral valve  insufficiency.  7.  Further ambulation thereafter and  further treatment options after improvement of hypoxia.   ____________________________ Lamar Blinks, MD bjk:TT D: 08/31/2014 21:01:00 ET T: 08/31/2014 21:44:36 ET JOB#: [id number]  cc: Lamar Blinks, MD, <Dictator> Lamar Blinks MD ELECTRONICALLY SIGNED 09/21/2014 15:46

## 2015-04-15 NOTE — Discharge Summary (Signed)
PATIENT NAME:  Misty Martinez, Misty Martinez MR#:  [id number] DATE OF BIRTH:  Dec 12, 1927  DATE OF ADMISSION:  08/30/2014 DATE OF DISCHARGE:  09/02/2014  ADMITTING DIAGNOSES: Anemia.   DISCHARGE DIAGNOSES: 1.  Acute on chronic respiratory failure.  2.  Interstitial pneumonia.  3.  Suspected acute on chronic diastolic congestive heart failure.  4.  Gastroesophageal reflux disease with suspected nocturnal aspirations contributing to pulmonary fibrosis.  5.  Anemia of chronic disease, status post 1 unit of packed red blood cell transfusion.  6.  Acute on chronic renal failure with attempted diuresis.  7.  History of hypertension, syncope, chronic kidney disease stages 3 to 4 and arthritis.   DISCHARGE CONDITION: Stable.   DISCHARGE MEDICATIONS: The patient is to continue: 1.  Coreg 3.125 mg p.o. twice daily. 2.  Albuterol ipratropium 2.5 mg/0.5 mg in 3 mL inhalation solution 6 times daily as needed.  3.  Guaifenesin (Humibid LA) 600 mg p.o. twice daily.  4.  Senna 1 tablet twice daily as needed.  5.  Zithromax 500 mg p.o. twice daily for 7 days.  6.  Tussionex 5 mL twice daily as needed.  7.  Omeprazole 40 mg p.o. once daily (this is a new medication).   NOTE: The patient is not to take amlodipine unless recommended by primary care physician due to low blood pressure.  HOME OXYGEN: With portable tank at 2 liters of oxygen through nasal cannula at rest. The patient will need 4 to 5 liters of oxygen while ambulating. The patient should be weaned off of oxygen as tolerated keeping O2 sats around 92% and above.  DISCHARGE DIET: Two grams salt, regular consistency.   ACTIVITY LIMITATIONS: As tolerated.   REFERRALS: To physical therapy, also respiratory therapy.  DIETARY SPECIAL INSTRUCTIONS: The patient was advised not to eat or drink about 3 hours prior to bedtime to prevent aspiration risks. The patient's head of bed also has to be elevated by approximately 30 degrees to prevent aspiration.    DISCHARGE INSTRUCTIONS: Followup appointment with Dr. Elizabeth Sauereanna Jones in 2 days after discharge, Dr. Meredeth IdeFleming in 1 week after discharge, Dr. Gwen PoundsKowalski in 1 week after discharge.  CONSULTANTS: Dr. Gwen PoundsKowalski, Dr. Meredeth IdeFleming, care management, social work.   RADIOLOGIC STUDIES: Chest x-ray, PA and lateral, 8th of September 2015, revealing progressive peripheral and lower lobe interstitial prominence suspicious for interstitial lung disease, possibly usual interstitial pneumonia. A chest CT scan would be informative. Cannot exclude superimposed mild interstitial edema. Moderate hiatal hernia was also noted. Patchy left base opacity for which superimposed infection or aspiration was a concern.   CT scan of the chest without contrast, 8th of September 2015, showed findings consistent with usual interstitial pneumonia. A large hiatal hernia was also noted.   HISTORY OF PRESENT ILLNESS: The patient is an 81 year old Caucasian female who presented to the hospital with complaints of shortness of breath. Please refer to Dr. Margaretmary EddyShah's admission note on the 8th of September 2015. Apparently, the patient was short of breath for at least 3 weeks which was a significant change from her prior activity level. She was initiated on antibiotic therapy during this period of time as well as cough medications; however, she did not improve.  On arrival to the Emergency Room, she was noted to be hypoxic with O2 sats of 88% on room air, and she was admitted to the hospital for further evaluation. The patient's temperature was 98.7, pulse was 88, respiratory rate was 22, blood pressure 159/73 and saturation was 91% on 2  liters of oxygen through nasal cannula and 88% on room air. Physical exam revealed crepitations in lungs.   DIAGNOSTIC DATA: The patient's lab data done on admission, 8th of September 2015, revealed beta-type natriuretic peptide of 1,261, glucose 110, and BUN and creatinine were 34 and 2.1. Bicarbonate level was low at 20.  Estimated GFR was 21. Liver enzymes revealed albumin level of 2.2, otherwise unremarkable. Troponin level was less than 0.02. White blood cell count was elevated to 12.8, hemoglobin was 7.8 and platelet count was 414,000. Coagulation panel was unremarkable.   EKG showed normal sinus rhythm at 91 beats per minute, LVH with repolarization abnormality, and no acute ST-T changes were noted.   Chest x-ray revealed possible mild congestive heart failure as well as interstitial pneumonitis.   HOSPITAL COURSE: The patient was admitted to the hospital for further evaluation. She was initiated on broad-spectrum antibiotic therapy, including Rocephin as well as Zithromax. Consultations with Dr. Gwen Pounds as well as Dr. Meredeth Ide were obtained. The patient had echocardiogram done on the 9th of September 2015, which revealed normal left ventricular ejection fraction of 55% to 60%, normal global left ventricular systolic function, decreased left ventricular internal cavity size, mildly dilated left atrium, mild to moderate mitral valve regurgitation, mildly increased left ventricular posterior wall thickness and mild tricuspid regurgitation. The patient was initiated on diuretic therapy because of concerns of possible mild CHF. Unfortunately, with this, the patient's creatinine went up to 2.7 by the 10th of September 2015. At that time, the patient's diuretics were stopped and blood pressure medication, Norvasc, was also stopped due to relative hypotension with systolic blood pressure being around 116. The patient was continued on antibiotics as well as inhalation therapy and her condition somewhat improved. Her oxygenation improved; however, it still required 2 liters. She was evaluated by physical therapist and recommended rehabilitation placement. The patient admitted of having some aspiration problems and feeling acid in her thought intermittently. Because of that, we were concerned about possible aspiration of acid from  the stomach. We recommended the patient to lift up her head of bed by approximately 30 degrees whenever she lies down and not to take anything to eat or drink approximately 3 hours prior to bedtime to prevent risks of aspiration and heal her lungs. We also initiated her on proton pump inhibitor, Omeprazole. The patient was advised to continue Zithromax therapy for total of 10 days, 7 days after discharge, by Dr. Meredeth Ide. She is to continue also cough medications and inhalation therapy. She is to follow up with Dr. Meredeth Ide as well as Dr. Gwen Pounds in approximately 1 week after discharge.   In regards to suspected acute on chronic diastolic CHF, we are not initiating her on ACE inhibitor due to renal failure. We are also not able to initiate her on diuretics due to increased worsening kidney failure and because of that we do feel that she is euvolemic if not hypovolemic overall. We do not think that she needs to be on diuretic therapy at this point.  In regards to gastroesophageal reflux disease with suspected nocturnal aspirations, the patient is to continue PPIs and follow up with her primary care physician.  For anemia, the patient was transfused with 1 unit of packed red blood cells. Unfortunately, the patient's iron studies were not performed prior to transfusion. Posttransfusion the patient's hemoglobin level remained stable and it was felt that the patient's anemia was of chronic disease related to her chronic renal failure.   In regards to acute on  chronic renal failure, as mentioned above, the patient's creatinine level was 2.1 on the day of admission, the 8th of September 2015, which is about the patient's baseline, with estimated GFR of 21. The patient's creatinine worsened to 2.7 by the 10th of September 2015 with diuresis; however, with stopping diuresis the patient's creatinine improved to 2.34 on the day of discharge, 11th of September 2015.  GFR is 18. It is recommended to follow the patient's  kidney function tests and possibly have her follow up with a nephrologist as outpatient to ensure complete resolution of her acute on chronic renal failure.   In regards to chronic medical problems such as hypertension, the patient's Norvasc was placed on hold since her blood pressure was relatively low signifying likely hypovolemia. The patient's blood pressure medication, Norvasc, should be restarted if her blood pressure is elevated.   In regards to arthritis, the patient is to continue her outpatient management.   The patient is being discharged in stable condition with the above-mentioned medications and follow-up.   On the day of discharge, the patient is afebrile with temperature of 98, pulse was 76, respiration rate was 18, blood pressure 126/75 and saturation was 94% on 2 liters of oxygen through nasal cannula. However, it drops down to 80% on exertion, which requires the patient's O2 to be bumped up to 4 to 5 liters on ambulation keeping her O2 sats at around 92% and above and trying to wean her down to room air if possible.  TIME SPENT: 40 minutes. ____________________________ Katharina Caper, MD rv:sb D: 09/02/2014 11:56:10 ET T: 09/02/2014 12:46:22 ET JOB#: [id number]  cc: Katharina Caper, MD, <Dictator> Duanne Limerick, MD Herbon E. Meredeth Ide, MD Lamar Blinks, MD Rayen Dafoe Winona Legato MD ELECTRONICALLY SIGNED 09/14/2014 14:28

## 2015-04-15 NOTE — H&P (Signed)
PATIENT NAME:  Misty Martinez, Misty Martinez MR#:  [id number] DATE OF BIRTH:  1927-05-03  DATE OF ADMISSION:  08/30/2014  PRIMARY CARE PHYSICIAN: Duanne Limerick, MD  REQUESTING PHYSICIAN: Sheryl L. Mindi Junker, MD  CHIEF COMPLAINT: Shortness of breath.   HISTORY OF PRESENT ILLNESS: The patient is an 81 year old female with a known history of CKD stage III-IV, hypertension, and history of recurrent syncope. Is being admitted for acute on chronic respiratory failure due to diastolic heart failure. The patient has been feeling short of breath on and off for the last 2-3 weeks.   Had seen her primary care physician, who placed her on amoxicillin and some cough medication, but did not have much improvement.  Went to see physician at another Urgent Care where she was prescribed ProAir inhaler along with prednisone taper and Levaquin for possible acute bronchitis, but she continued to get worse and was feeling more and more short of breath. Finally, decided to come to the Emergency Department where she was found to be hypoxic with room air oxygen saturations of 88% and was placed on 2-3 liters oxygen. She was also found to be anemic with hemoglobin of 7.8 and was ordered 1 unit of blood in the Emergency Department. Her chest x-ray was showing possible interstitial pneumonia and her CT of the chest in the ED was consistent with interstitial pneumonia. She also had large hiatal hernia. The patient also reported low grade fever at home and some dry cough on and off for the last 2-3 weeks. She is being admitted for further evaluation and management.   PAST MEDICAL HISTORY: 1.  Hypertension.  2.  Recurrent syncope.  3.  CKD stage III-IV. 4.  Arthritis.   ALLERGIES: No known drug allergies.   SOCIAL HISTORY: She lives with her husband. No smoking. No alcohol. No IV drugs of abuse.   CODE STATUS: Full code.   FAMILY HISTORY: Mother died at the age of 24 due to Alzheimer's. Father died at the age of 48 due to heart  failure. Sister with hypertension; brother died of alcoholism.   MEDICATIONS AT HOME: 1.  Coreg 3.125 mg p.o. b.i.d.  2.  Amlodipine 5 mg p.o. daily.  3.  She just recently finished course of prednisone taper, antibiotics, and inhaler that she uses intermittently as needed without much help.  REVIEW OF SYSTEMS:  CONSTITUTIONAL: Positive for low grade fever, fatigue, and weakness.  EYES: No blurred or double vision.  ENT: No tinnitus or ear pain.  RESPIRATORY: Positive for dry cough, dyspnea.  CARDIOVASCULAR: No chest pain. Positive for dyspnea on exertion.  GASTROINTESTINAL: No nausea, vomiting, diarrhea.  GENITOURINARY: No dysuria or hematuria.  ENDOCRINE: No polyuria or nocturia.  HEMATOLOGY: Anemia present. No easy bruising or bleeding.  SKIN: No rash or lesion.  MUSCULOSKELETAL: Arthritis present.  NEUROLOGIC: No tingling, numbness, weakness. Positive for recurrent syncope.  PSYCHIATRY: No history of anxiety or depression.   PHYSICAL EXAMINATION: VITAL SIGNS: Temperature 98.7, heart rate 88 per minute, respirations 22 per minute, blood pressure 159/73 mmHg. She is saturating 91% on 2 liters oxygen via nasal cannula and was down to 88% at room air.  GENERAL: The patient is an 81 year old female lying in bed in acute respiratory distress.  HEENT: Pupils equal, round, reactive to light and accommodation. No scleral icterus. Extraocular muscles intact. Head atraumatic, normocephalic. Oropharynx and nasopharynx clear.  NECK: Supple. No jugular venous distention. No thyroid enlargement or tenderness.  LUNGS: Decreased breath sounds at the bases. No wheezing, rales. She  does have occasional rhonchi at the right lower lobe.  CARDIOVASCULAR: S1, S2 normal. No murmur, rubs, or gallop. ABDOMEN:  Soft, nontender, nondistended. Bowel sounds present. No organomegaly or masses.  EXTREMITIES: No pedal edema, cyanosis, or clubbing.  NEUROLOGIC: Cranial nerves III through XII intact. Muscle strength  5/5 in all extremities. Sensation intact.  PSYCHIATRIC: The patient is alert and oriented x 3.  SKIN: No obvious rash, lesion, or ulcer.  MUSCULOSKELETAL: No joint effusion or tenderness.   LABORATORY DATA:  BMP showed sodium 136, potassium 4.3, chloride 108, CO2 of 20, BUN 34, creatinine 2.10, blood sugar 110. BNP of 1261. Troponin of less than 0.02. CBC showed white count of 12.8, hemoglobin 7.8, hematocrit 25.0, platelet 414,000.   IMAGING:  Chest x-ray in the ED showed interstitial pneumonia. CT scan of the chest confirmed same findings with large hiatal hernia.   EKG did not show any major ST-T changes.   IMPRESSION AND PLAN: 1.  Acute hypoxic respiratory failure, likely due to pneumonia and/or congestive heart failure exacerbation with underlying severe anemia.  2.  Acute on chronic diastolic heart failure exacerbation. We will start her on intravenous Lasix twice a day.  Obtain cardiology consultation. Monitor on telemetry unit daily.  Get 2D echocardiogram, daily weight, and strict input and output will be maintained.  3.  Anemia, likely of chronic disease, certainly could have iron deficiency. Her stool Hemoccult was positive in the Emergency Department. She reports that she has had a gastroenterology doctor place her on iron therapy and her hemoglobin has been ranging anywhere from 9-10. This has been more lower.  She was already ordered 1 unit of blood in the Emergency Department that she will go ahead and receive.  She did have an elevated vitamin B12 level which was checked in 2014, and normal erythropoietin level. She had an iron level which was within normal limits.  This could be anemia of chronic disease. Consider gastroenterology consultation if she continues to drop her hemoglobin. She did have heme-positive stool. Please note, she has been following with Dr. Lorre NickGittin for evaluation of iron deficiency anemia and he considering IV iron if needed. He did recommend colonoscopy to evaluate  for any underlying gastrointestinal malignancy, but patient declined at that time for any luminal evaluation.  4.  Pneumonia. We will start her on intravenous Rocephin and Zithromax. Obtain sputum culture if able, doubt it.    5.  Chronic kidney disease stage III-IV, likely close to her baseline. Will monitor her kidney function while on diuresis. Consider nephrology consultation. She follows with Dr.  Mady HaagensenMunsoor Lateef.   6.  Code status, full code. Considering palliative care consultation.   Total time taking care of this patient was 45 minutes.    ____________________________ Ellamae SiaVipul S. Sherryll BurgerShah, MD vss:LT D: 08/30/2014 22:30:15 ET T: 08/30/2014 23:03:06 ET JOB#: [id number]  cc: Ami Mally S. Sherryll BurgerShah, MD, <Dictator> Duanne Limerickeanna C. Jones, MD Munsoor Lizabeth LeydenN. Lateef, MD Ellamae SiaVIPUL S [facility] MD ELECTRONICALLY SIGNED 09/05/2014 11:49

## 2015-08-07 ENCOUNTER — Other Ambulatory Visit: Payer: Self-pay | Admitting: Family Medicine

## 2016-02-12 ENCOUNTER — Ambulatory Visit (INDEPENDENT_AMBULATORY_CARE_PROVIDER_SITE_OTHER): Payer: Medicare Other

## 2016-02-12 ENCOUNTER — Ambulatory Visit
Admission: EM | Admit: 2016-02-12 | Discharge: 2016-02-12 | Disposition: A | Discharge status: Home / Self Care | Payer: Medicare Other | Attending: Family Medicine | Admitting: Family Medicine

## 2016-02-12 ENCOUNTER — Encounter: Payer: Self-pay | Admitting: *Deleted

## 2016-02-12 DIAGNOSIS — B349 Viral infection, unspecified: Secondary | ICD-10-CM

## 2016-02-12 MED ORDER — BENZONATATE 200 MG PO CAPS: 200.0000 mg | ORAL_CAPSULE | Freq: Three times a day (TID) | ORAL | Status: AC | PRN

## 2016-02-12 MED ORDER — IPRATROPIUM-ALBUTEROL 0.5-2.5 (3) MG/3ML IN SOLN
3.0000 mL | Freq: Once | RESPIRATORY_TRACT | Status: AC
  Administered 2016-02-12: 3 mL via RESPIRATORY_TRACT

## 2016-02-12 NOTE — ED Notes (Signed)
Patient started having nasal congestion, cough, and wheezing started 2 days ago.

## 2016-02-12 NOTE — ED Provider Notes (Signed)
CSN: [id number]     Arrival date & time 02/12/16  1000 History   First MD Initiated Contact with Patient 02/12/16 1218     Chief Complaint  Patient presents with  . Cough  . Nasal Congestion   (Consider location/radiation/quality/duration/timing/severity/associated sxs/prior Treatment) Patient is a 82 y.o. female presenting with URI. The history is provided by the patient.  URI Presenting symptoms: congestion, cough and rhinorrhea   Onset quality:  Sudden Duration:  3 days Timing:  Constant Progression:  Unchanged Chronicity:  New Relieved by:  None tried Ineffective treatments:  OTC medications Associated symptoms: wheezing   Associated symptoms: no arthralgias, no headaches, no myalgias and no sinus pain   Risk factors: being elderly and sick contacts   Risk factors: no chronic cardiac disease, no chronic kidney disease, no chronic respiratory disease, no diabetes mellitus, no immunosuppression, no recent illness and no recent travel     Past Medical History  Diagnosis Date  . Hyperlipidemia   . Syncope   . Valvular heart disease   . Heart murmur   . Hypertension    Past Surgical History  Procedure Laterality Date  . Cholecystectomy    . Cataract surgery    . Cref      right distal radius fracture  . Subacromial decompression      left   History reviewed. No pertinent family history. Social History  Substance Use Topics  . Smoking status: Never Smoker   . Smokeless tobacco: None  . Alcohol Use: No   OB History    No data available     Review of Systems  HENT: Positive for congestion and rhinorrhea.   Respiratory: Positive for cough and wheezing.   Musculoskeletal: Negative for myalgias and arthralgias.  Neurological: Negative for headaches.    Allergies  Review of patient's allergies indicates no known allergies.  Home Medications   Prior to Admission medications   Medication Sig Start Date End Date Taking? Authorizing Provider  amLODipine  (NORVASC) 10 MG tablet Take 10 mg by mouth daily.   Yes Historical Provider, MD  Ascorbic Acid (VITAMIN C PO) Take 1 tablet by mouth daily.   Yes Historical Provider, MD  aspirin 81 MG tablet Take 81 mg by mouth daily.   Yes Historical Provider, MD  carvedilol (COREG) 6.25 MG tablet Take 6.25 mg by mouth 2 (two) times daily with a meal.   Yes Historical Provider, MD  Omega-3 Fatty Acids (FISH OIL PO) Take 1 capsule by mouth daily.   Yes Historical Provider, MD  sodium bicarbonate 650 MG tablet Take 650 mg by mouth 3 (three) times daily.   Yes Historical Provider, MD  amLODipine (NORVASC) 5 MG tablet Take 5 mg by mouth daily.    Historical Provider, MD  benzonatate (TESSALON) 200 MG capsule Take 1 capsule (200 mg total) by mouth 3 (three) times daily as needed for cough. 02/12/16   Payton Mccallum, MD  Cholecalciferol (VITAMIN D) 1000 UNITS capsule Take 1,000 Units by mouth daily.    Historical Provider, MD   Meds Ordered and Administered this Visit   Medications  ipratropium-albuterol (DUONEB) 0.5-2.5 (3) MG/3ML nebulizer solution 3 mL (3 mLs Nebulization Given 02/12/16 1232)    BP 171/85 mmHg  Pulse 82  Temp(Src) 97.5 F (36.4 C) (Oral)  Resp 18  Ht 5' (1.524 m)  Wt 130 lb (58.968 kg)  BMI 25.39 kg/m2  SpO2 95% No data found.   Physical Exam  Constitutional: She is oriented to person, place,  and time. She appears well-developed and well-nourished. No distress.  HENT:  Head: Normocephalic.  Right Ear: Tympanic membrane, external ear and ear canal normal.  Left Ear: Tympanic membrane, external ear and ear canal normal.  Nose: Rhinorrhea present.  Mouth/Throat: Mucous membranes are normal. Posterior oropharyngeal erythema present. No oropharyngeal exudate, posterior oropharyngeal edema or tonsillar abscesses.  Eyes: Conjunctivae and EOM are normal. Pupils are equal, round, and reactive to light. Right eye exhibits no discharge. Left eye exhibits no discharge. No scleral icterus.  Neck:  Normal range of motion. Neck supple. No JVD present. No tracheal deviation present. No thyromegaly present.  Cardiovascular: Normal rate, regular rhythm, normal heart sounds and intact distal pulses.   No murmur heard. Pulmonary/Chest: Effort normal. No stridor. No respiratory distress. She has wheezes (diffuse bilaterally). She has rales (left lower lung). She exhibits no tenderness.  Musculoskeletal: She exhibits no edema.  Lymphadenopathy:    She has no cervical adenopathy.  Neurological: She is alert and oriented to person, place, and time. She has normal reflexes.  Skin: Skin is warm and dry. No rash noted. She is not diaphoretic.  Nursing note and vitals reviewed.   ED Course  Procedures (including critical care time)  Labs Review Labs Reviewed - No data to display  Imaging Review Dg Chest 2 View  02/12/2016  CLINICAL DATA:  82 year old female with nonproductive cough and wheezing for 4 days. History of bronchitis 2 years ago. Initial encounter. EXAM: CHEST  2 VIEW COMPARISON:  09/26/2014 and 08/08/2014 chest x-ray. 08/30/2014 chest CT. FINDINGS: Chronic lung changes without new segmental consolidation or pulmonary edema. Scarring left costophrenic angle similar to 2015 chest x-ray. Moderate-size hiatal hernia. Heart size top-normal. Calcified slightly tortuous aorta. IMPRESSION: Chronic lung changes without new segmental consolidation or pulmonary edema. Moderate-size hiatal hernia. Electronically Signed   By: Lacy Duverney M.D.   On: 02/12/2016 13:36     Visual Acuity Review  Right Eye Distance:   Left Eye Distance:   Bilateral Distance:    Right Eye Near:   Left Eye Near:    Bilateral Near:         MDM   1. Viral syndrome      Discharge Medication List as of 02/12/2016  2:11 PM    START taking these medications   Details  benzonatate (TESSALON) 200 MG capsule Take 1 capsule (200 mg total) by mouth 3 (three) times daily as needed for cough., Starting 02/12/2016,  Until Discontinued, Normal       1. x-ray results and diagnosis reviewed with patient 2. rx as per orders above; reviewed possible side effects, interactions, risks and benefits  3. Patient given duoneb treatment with improvement of symptoms 4.Recommend supportive treatment with rest, increased fluids 5. Follow-up prn if symptoms worsen or don't improve  Payton Mccallum, MD 02/12/16 1443

## 2018-04-01 ENCOUNTER — Other Ambulatory Visit: Payer: Self-pay | Admitting: Family Medicine

## 2018-04-01 ENCOUNTER — Ambulatory Visit
Admission: RE | Admit: 2018-04-01 | Discharge: 2018-04-01 | Disposition: A | Discharge status: Home / Self Care | Payer: Medicare Other | Source: Ambulatory Visit | Attending: Family Medicine | Admitting: Family Medicine

## 2018-04-01 DIAGNOSIS — R0989 Other specified symptoms and signs involving the circulatory and respiratory systems: Secondary | ICD-10-CM

## 2018-04-01 DIAGNOSIS — K449 Diaphragmatic hernia without obstruction or gangrene: Secondary | ICD-10-CM | POA: Insufficient documentation

## 2018-04-01 DIAGNOSIS — R918 Other nonspecific abnormal finding of lung field: Secondary | ICD-10-CM | POA: Diagnosis not present

## 2018-04-01 DIAGNOSIS — J441 Chronic obstructive pulmonary disease with (acute) exacerbation: Secondary | ICD-10-CM | POA: Diagnosis not present

## 2019-07-24 DEATH — deceased
